# Patient Record
Sex: Female | Born: 1970 | Hispanic: No | Marital: Married | Smoking: Never smoker
Health system: Southern US, Community
[De-identification: ages and names within clinical notes are randomized; demographics above are authoritative.]

## PROBLEM LIST (undated history)

## (undated) DIAGNOSIS — B343 Parvovirus infection, unspecified: Secondary | ICD-10-CM

## (undated) DIAGNOSIS — E559 Vitamin D deficiency, unspecified: Secondary | ICD-10-CM

## (undated) DIAGNOSIS — B370 Candidal stomatitis: Secondary | ICD-10-CM

## (undated) DIAGNOSIS — I319 Disease of pericardium, unspecified: Secondary | ICD-10-CM

## (undated) DIAGNOSIS — M329 Systemic lupus erythematosus, unspecified: Secondary | ICD-10-CM

## (undated) DIAGNOSIS — I10 Essential (primary) hypertension: Secondary | ICD-10-CM

## (undated) DIAGNOSIS — M01X0 Direct infection of unspecified joint in infectious and parasitic diseases classified elsewhere: Secondary | ICD-10-CM

## (undated) DIAGNOSIS — R5383 Other fatigue: Secondary | ICD-10-CM

## (undated) DIAGNOSIS — M359 Systemic involvement of connective tissue, unspecified: Secondary | ICD-10-CM

## (undated) DIAGNOSIS — IMO0002 Reserved for concepts with insufficient information to code with codable children: Secondary | ICD-10-CM

## (undated) HISTORY — DX: Vitamin D deficiency, unspecified: E55.9

## (undated) HISTORY — PX: HERNIA REPAIR: SHX51

## (undated) HISTORY — DX: Direct infection of unspecified joint in infectious and parasitic diseases classified elsewhere: B34.3

## (undated) HISTORY — DX: Disease of pericardium, unspecified: I31.9

## (undated) HISTORY — DX: Direct infection of unspecified joint in infectious and parasitic diseases classified elsewhere: M01.X0

## (undated) HISTORY — DX: Essential (primary) hypertension: I10

## (undated) HISTORY — DX: Other fatigue: R53.83

## (undated) HISTORY — DX: Systemic involvement of connective tissue, unspecified: M35.9

## (undated) HISTORY — DX: Candidal stomatitis: B37.0

---

## 2010-12-08 ENCOUNTER — Emergency Department (HOSPITAL_BASED_OUTPATIENT_CLINIC_OR_DEPARTMENT_OTHER)
Admission: EM | Admit: 2010-12-08 | Discharge: 2010-12-08 | Disposition: A | Discharge status: Other Acute Inpatient Hospital | Payer: Commercial Managed Care - PPO | Source: Home / Self Care | Attending: Emergency Medicine | Admitting: Emergency Medicine

## 2010-12-08 ENCOUNTER — Emergency Department (INDEPENDENT_AMBULATORY_CARE_PROVIDER_SITE_OTHER): Payer: Commercial Managed Care - PPO

## 2010-12-08 ENCOUNTER — Inpatient Hospital Stay (HOSPITAL_COMMUNITY)
Admission: AD | Admit: 2010-12-08 | Discharge: 2010-12-10 | DRG: 315 | Disposition: A | Discharge status: Home / Self Care | Payer: Commercial Managed Care - PPO | Source: Other Acute Inpatient Hospital | Attending: Internal Medicine | Admitting: Internal Medicine

## 2010-12-08 DIAGNOSIS — R209 Unspecified disturbances of skin sensation: Secondary | ICD-10-CM

## 2010-12-08 DIAGNOSIS — R0602 Shortness of breath: Secondary | ICD-10-CM | POA: Insufficient documentation

## 2010-12-08 DIAGNOSIS — M6282 Rhabdomyolysis: Secondary | ICD-10-CM | POA: Insufficient documentation

## 2010-12-08 DIAGNOSIS — D72829 Elevated white blood cell count, unspecified: Secondary | ICD-10-CM | POA: Diagnosis present

## 2010-12-08 DIAGNOSIS — R079 Chest pain, unspecified: Secondary | ICD-10-CM | POA: Insufficient documentation

## 2010-12-08 DIAGNOSIS — R0789 Other chest pain: Secondary | ICD-10-CM

## 2010-12-08 DIAGNOSIS — G43909 Migraine, unspecified, not intractable, without status migrainosus: Secondary | ICD-10-CM | POA: Diagnosis present

## 2010-12-08 DIAGNOSIS — I319 Disease of pericardium, unspecified: Principal | ICD-10-CM | POA: Diagnosis present

## 2010-12-08 DIAGNOSIS — M329 Systemic lupus erythematosus, unspecified: Secondary | ICD-10-CM | POA: Diagnosis present

## 2010-12-08 LAB — CBC
Hemoglobin: 12.4 g/dL (ref 12.0–15.0)
MCH: 32.1 pg (ref 26.0–34.0)
MCV: 94.3 fL (ref 78.0–100.0)
Platelets: 256 10*3/uL (ref 150–400)
RBC: 3.86 MIL/uL — ABNORMAL LOW (ref 3.87–5.11)
WBC: 10.8 10*3/uL — ABNORMAL HIGH (ref 4.0–10.5)

## 2010-12-08 LAB — URINE MICROSCOPIC-ADD ON

## 2010-12-08 LAB — CK TOTAL AND CKMB (NOT AT ARMC)
CK, MB: 1.8 ng/mL (ref 0.3–4.0)
CK, MB: 1.5 ng/mL (ref 0.3–4.0)
CK, MB: 1.3 ng/mL (ref 0.3–4.0)
Relative Index: 0.1 (ref 0.0–2.5)
Relative Index: 0.1 (ref 0.0–2.5)
Relative Index: 0.1 (ref 0.0–2.5)
Total CK: 1716 U/L — ABNORMAL HIGH (ref 7–177)

## 2010-12-08 LAB — URINALYSIS, ROUTINE W REFLEX MICROSCOPIC
Glucose, UA: NEGATIVE mg/dL
Hgb urine dipstick: NEGATIVE
Leukocytes, UA: NEGATIVE
Specific Gravity, Urine: 1.025 (ref 1.005–1.030)
pH: 7.5 (ref 5.0–8.0)

## 2010-12-08 LAB — DIFFERENTIAL
Eosinophils Absolute: 0 10*3/uL (ref 0.0–0.7)
Lymphs Abs: 1.7 10*3/uL (ref 0.7–4.0)
Monocytes Relative: 4 % (ref 3–12)
Neutro Abs: 8.6 10*3/uL — ABNORMAL HIGH (ref 1.7–7.7)
Neutrophils Relative %: 80 % — ABNORMAL HIGH (ref 43–77)

## 2010-12-08 LAB — TROPONIN I
Troponin I: <0.3 ng/mL (ref <0.30)
Troponin I: <0.3 ng/mL (ref <0.30)

## 2010-12-08 LAB — COMPREHENSIVE METABOLIC PANEL
Albumin: 3.5 g/dL (ref 3.5–5.2)
BUN: 10 mg/dL (ref 6–23)
Calcium: 9.1 mg/dL (ref 8.4–10.5)
Glucose, Bld: 118 mg/dL — ABNORMAL HIGH (ref 70–99)
Total Protein: 7.9 g/dL (ref 6.0–8.3)

## 2010-12-08 LAB — D-DIMER, QUANTITATIVE: D-Dimer, Quant: 0.23 ug/mL-FEU (ref 0.00–0.48)

## 2010-12-09 LAB — BASIC METABOLIC PANEL
Calcium: 7.9 mg/dL — ABNORMAL LOW (ref 8.4–10.5)
Chloride: 110 mEq/L (ref 96–112)
Creatinine, Ser: 0.86 mg/dL (ref 0.4–1.2)
GFR calc Af Amer: >60 mL/min (ref >60)

## 2010-12-09 LAB — LIPID PANEL
Cholesterol: 182 mg/dL (ref 0–200)
LDL Cholesterol: 72 mg/dL (ref 0–99)
Total CHOL/HDL Ratio: 2.2 RATIO

## 2010-12-09 LAB — CBC
MCH: 32 pg (ref 26.0–34.0)
Platelets: 227 10*3/uL (ref 150–400)
RBC: 3.28 MIL/uL — ABNORMAL LOW (ref 3.87–5.11)

## 2010-12-09 LAB — T4, FREE: Free T4: 0.84 ng/dL (ref 0.80–1.80)

## 2010-12-09 LAB — HEMOGLOBIN A1C
Hgb A1c MFr Bld: 5.5 % (ref <5.7)
Mean Plasma Glucose: 111 mg/dL (ref <117)

## 2010-12-09 LAB — TSH: TSH: 0.247 u[IU]/mL — ABNORMAL LOW (ref 0.350–4.500)

## 2010-12-09 LAB — CK TOTAL AND CKMB (NOT AT ARMC): CK, MB: 1.3 ng/mL (ref 0.3–4.0)

## 2010-12-10 ENCOUNTER — Inpatient Hospital Stay (HOSPITAL_COMMUNITY): Payer: Commercial Managed Care - PPO

## 2010-12-10 LAB — CBC
MCH: 31.7 pg (ref 26.0–34.0)
MCHC: 33.4 g/dL (ref 30.0–36.0)
Platelets: 226 10*3/uL (ref 150–400)

## 2010-12-10 LAB — BASIC METABOLIC PANEL
CO2: 22 mEq/L (ref 19–32)
Calcium: 7.9 mg/dL — ABNORMAL LOW (ref 8.4–10.5)
Creatinine, Ser: 0.74 mg/dL (ref 0.4–1.2)
Glucose, Bld: 96 mg/dL (ref 70–99)
Sodium: 138 mEq/L (ref 135–145)

## 2010-12-10 LAB — CK TOTAL AND CKMB (NOT AT ARMC): Total CK: 408 U/L — ABNORMAL HIGH (ref 7–177)

## 2010-12-10 MED ORDER — IOHEXOL 300 MG/ML  SOLN
100.0000 mL | Freq: Once | INTRAMUSCULAR | Status: AC | PRN
  Administered 2010-12-10: 100 mL via INTRAVENOUS

## 2010-12-11 NOTE — Discharge Summary (Signed)
[name], [name] NO.:  [id number]  MEDICAL RECORD NO.:  [id number]  LOCATION:  2504                         FACILITY:  [facility]  PHYSICIAN:  Thad Ranger, MD       DATE OF BIRTH:  1971-06-06  DATE OF ADMISSION:  12/08/2010 DATE OF DISCHARGE:  12/10/2010                        DISCHARGE SUMMARY - REFERRING   PRIMARY NEUROLOGIST:  Dr. Despina Arias.  DISCHARGE DIAGNOSES: 1. Atypical chest pain likely secondary to pericarditis. 2. Recent diagnosis of lupus. 3. Mild rhabdomyolysis. 4. Migraine headaches.  DISCHARGE MEDICATIONS: 1. Indomethacin 25 mg p.o. t.i.d. with meals. 2. Protonix 40 mg p.o. daily. 3. Topiramate 50 mg p.o. nightly. 4. Tramadol 50 mg p.o. q.6 h. p.r.n. pain. 5. Oxycodone 5/325 mg 1-2 tablets p.o. every 4 hours as needed, 45     tablets assistance. 6. Vyvanse 70 mg p.o. daily. 7. Ocella 1 tablet p.o. daily. 8. Prednisone 60 mg for 1 week, then 50 mg for 1 week, then 40 mg for     1 week, then and 30 mg for 1 week, then 20 mg for 1 week, then 10     mg for 1 week, then continue per Hematology recommendations.  BRIEF HISTORY OF PRESENT ILLNESS:  Ms. 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  States that about a week ago she started having diffuse arthralgias include hands, right knee, ankle, right shoulder, and both hips.  The patient subsequently had rheumatological studies done which showed very high anti-double-stranded DNA  was diagnosed with lupus.  The patient had been on 2 steroid dose pack and was currently on second dose pack.  She felt that she was extremely weak and fatigued and then a night prior to the admission started having a left-sided chest pain which was worse with deep breathing and inspiration and leaning forward.  She tried taking Topamax, however, did not improve her pain.  However, because of concern she has her husband to drive her to the emergency department  for evaluation.  RADIOLOGICAL DATA:  Chest x-ray two-view on June 4, no evidence of active pulmonary disease.  Echocardiogram on June 4, systolic function normal, EF 60-65%, normal wall motion.  CT angiogram of the chest June 6, no pulmonary emboli, small bilateral pleural effusions plating independently, mild pericardial thickening with injection of epicardial fat, findings could possibly relate to pericarditis.  No measurable pericardial effusion.  Retroperitoneal flap of the upper abdomen also appears abnormally edematous could relate to lupus presentation.  BRIEF HOSPITALIZATION COURSE:  [name] is a 40 year old female who was admitted with atypical chest pain likely secondary to pericarditis. Chest pain has improved.  The patient likely had pericarditis related to her recent diagnosis of lupus.  She was admitted to tele monitored floor.  Cardiac enzymes remained negative for acute ACS.  However, the patient was noticed to have elevated CKs which peaked at 3347.  The patient was started on steroids and indomethacin.  Echocardiogram was obtained which showed normal EF with no pericardial effusion with IV fluid, CKs improved to 408 at the time of discharge. The patient did have intermittent chest pain during the hospitalization after starting the prednisone  which prompted CT angiogram of the chest which showed no pulmonary embolism.  The patient does not have any pericardial effusion. However, mild pericardial thickening possibly secondary to pericarditis. The patient will continue steroid with taper and follow up with Dr. Zenovia Jordan from Rheumatology Service on January 12, 2011.  The patient will be discharged home today.  Discharge time 35 minutes.     Thad Ranger, MD     RR/MEDQ  D:  12/10/2010  T:  12/10/2010  Job:  [id number]  cc:   Dr. Zenovia Jordan Dr. Despina Arias  Electronically Signed by Andres Labrum RAI  on 12/11/2010 01:02:31 PM

## 2010-12-31 NOTE — H&P (Signed)
[name], [name] NO.:  [id number]  MEDICAL RECORD NO.:  [id number]  LOCATION:  2504                         FACILITY:  [facility]  PHYSICIAN:  Peggye Pitt, M.D. DATE OF BIRTH:  19-Mar-1971  DATE OF ADMISSION:  12/08/2010 DATE OF DISCHARGE:                             HISTORY & PHYSICAL   PRIMARY CARE PHYSICIAN:  She is unassigned.  CHIEF COMPLAINT:  Chest pain.  HISTORY OF PRESENT ILLNESS:  [name] is a 40 year old woman.  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  She states that about 8 weeks ago she started having diffuse arthralgias including the hands, the right knee, both ankles, right shoulder, and both hips.  She subsequently had rheumatological studies and was found to have a very high antidouble-stranded DNA and was diagnosed with lupus.  Since then, she has been on 2 steroid dose packs and is currently on her second dose pack.  She notes that she has beenextremely weak and fatigued, but particularly starting last night at about 8 p.m. she started having left-sided chest pain.  It was worse with deep inspiration and leaning forward.  She tried taking Topamax, however, this did not improve her pain.  She tolerated the pain at home until 1 a.m.  However, because of concern, she asked her husband to drive her to the emergency department for evaluation.  She was subsequently transferred to [facility] for further evaluation and management by the Hospitalist Service.  ALLERGIES:  She has no known drug allergies.  PAST MEDICAL HISTORY:  Significant for migraine headaches.  HOME MEDICATIONS:  Topamax 50 mg daily for headaches.  She takes an oral contraceptive pill.  She has been taking melatonin to help her sleep. Rest of medications as per pharmacy reconciliation unavailable.  Vyvanse 70 mg daily.  SOCIAL HISTORY:  Denies alcohol, tobacco, or illicit drug use.  She is married, has 2 children.  She is very active in  sports.  FAMILY HISTORY:  Not significant for heart disease, cancer, strokes, or autoimmune diseases.  REVIEW OF SYSTEMS:  Only positive as stated in the HPI for diffuse arthralgias.  She also complains of a faint reticular rash over her extremities that has now disappeared.  She also complains of severe weakness and fatigue.  PHYSICAL EXAMINATION:  VITAL SIGNS:  On admission include a blood pressure 107/76, heart rate 74, respirations 14, sats of 99% on room air, and a temp of 97.9. GENERAL:  She is alert, awake, and oriented x3.  Her chest pain is currently improved. HEENT:  Normocephalic, atraumatic.  Her pupils are equally round and reactive to light and accommodation.  She has intact extraocular movements. NECK:  Supple.  No JVD, no lymphadenopathy, no bruits, no goiter. HEART:  Regular rate and rhythm with no appreciable murmurs, rubs, or gallops. LUNGS:  Clear to auscultation bilaterally. ABDOMEN:  Soft, nontender, and nondistended.  Positive bowel sounds. EXTREMITIES:  She has no lower extremity clubbing, cyanosis, or edema. She does have swollen MCPs and PIPs of bilateral hands.  LABORATORY DATA ON ADMISSION:  Sodium of 135, potassium 4.0, chloride 103, bicarb 23, BUN 10, creatinine 0.90, and glucose of 118.  All of  her LFTs are within normal limits with the exception of an AST of 52.  WBCs 10.8, hemoglobin 12.4, and platelets of 256.  Urinalysis is negative.  D- dimer is 0.23.  Cardiac enzymes are significant for a CPK of 3400, but otherwise normal CK-MBs and troponins.  Chest x-ray shows no acute distress.  ASSESSMENT AND PLAN: 1. Chest pain.  At this point, I believe this is more likely     pericarditis related to her recent diagnosis of lupus.  We will     admit her to Telemetry.  I will continue her prednisone as well as     start her on indomethacin.  We will get a 2-D echocardiogram and     follow up with 12-lead EKGs.  If pain does not abate, then maybe      cardiology consult will be indicated.  We will also rule her out     for acute coronary syndrome, although I truly doubt that this is     what is going on.  We will check a fasting lipid profile. 2. Mild rhabdomyolysis.  CK level is of 3400.  We will start her on     100 mL of normal saline as to prevent any signs of renal failure. 3. Lupus.  She is requesting that we hook her up with Dr. Zenovia Jordan, rheumatologist upon discharge.  I will see if this is     possible with our case management group. 4. DVT prophylaxis.  I will place her on Lovenox.     Peggye Pitt, M.D.     EH/MEDQ  D:  12/08/2010  T:  12/08/2010  Job:  [id number]  Electronically Signed by Peggye Pitt M.D. on 12/31/2010 07:46:25 PM

## 2012-12-13 DIAGNOSIS — M3212 Pericarditis in systemic lupus erythematosus: Secondary | ICD-10-CM | POA: Insufficient documentation

## 2014-04-30 DIAGNOSIS — M329 Systemic lupus erythematosus, unspecified: Secondary | ICD-10-CM | POA: Insufficient documentation

## 2014-07-20 ENCOUNTER — Emergency Department (HOSPITAL_BASED_OUTPATIENT_CLINIC_OR_DEPARTMENT_OTHER)
Admission: EM | Admit: 2014-07-20 | Discharge: 2014-07-21 | Disposition: A | Discharge status: Home / Self Care | Payer: No Typology Code available for payment source | Attending: Emergency Medicine | Admitting: Emergency Medicine

## 2014-07-20 ENCOUNTER — Emergency Department (HOSPITAL_BASED_OUTPATIENT_CLINIC_OR_DEPARTMENT_OTHER): Payer: No Typology Code available for payment source

## 2014-07-20 ENCOUNTER — Encounter (HOSPITAL_BASED_OUTPATIENT_CLINIC_OR_DEPARTMENT_OTHER): Payer: Self-pay | Admitting: *Deleted

## 2014-07-20 DIAGNOSIS — R0789 Other chest pain: Secondary | ICD-10-CM

## 2014-07-20 DIAGNOSIS — Z8739 Personal history of other diseases of the musculoskeletal system and connective tissue: Secondary | ICD-10-CM | POA: Diagnosis not present

## 2014-07-20 DIAGNOSIS — Z79899 Other long term (current) drug therapy: Secondary | ICD-10-CM | POA: Insufficient documentation

## 2014-07-20 DIAGNOSIS — R079 Chest pain, unspecified: Secondary | ICD-10-CM | POA: Diagnosis present

## 2014-07-20 DIAGNOSIS — Z3202 Encounter for pregnancy test, result negative: Secondary | ICD-10-CM | POA: Diagnosis not present

## 2014-07-20 HISTORY — DX: Reserved for concepts with insufficient information to code with codable children: IMO0002

## 2014-07-20 HISTORY — DX: Systemic lupus erythematosus, unspecified: M32.9

## 2014-07-20 LAB — CBC WITH DIFFERENTIAL/PLATELET
BASOS PCT: 0 % (ref 0–1)
Basophils Absolute: 0 10*3/uL (ref 0.0–0.1)
Eosinophils Absolute: 0.1 10*3/uL (ref 0.0–0.7)
Eosinophils Relative: 1 % (ref 0–5)
HCT: 36.3 % (ref 36.0–46.0)
HEMOGLOBIN: 12.4 g/dL (ref 12.0–15.0)
LYMPHS ABS: 2.7 10*3/uL (ref 0.7–4.0)
Lymphocytes Relative: 29 % (ref 12–46)
MCH: 33.1 pg (ref 26.0–34.0)
MCHC: 34.2 g/dL (ref 30.0–36.0)
MCV: 96.8 fL (ref 78.0–100.0)
MONO ABS: 1.1 10*3/uL — AB (ref 0.1–1.0)
MONOS PCT: 12 % (ref 3–12)
NEUTROS ABS: 5.4 10*3/uL (ref 1.7–7.7)
Neutrophils Relative %: 58 % (ref 43–77)
Platelets: 297 10*3/uL (ref 150–400)
RBC: 3.75 MIL/uL — ABNORMAL LOW (ref 3.87–5.11)
RDW: 12.2 % (ref 11.5–15.5)
WBC: 9.4 10*3/uL (ref 4.0–10.5)

## 2014-07-20 LAB — COMPREHENSIVE METABOLIC PANEL
ALT: 18 U/L (ref 0–35)
ANION GAP: 6 (ref 5–15)
AST: 27 U/L (ref 0–37)
Albumin: 3.9 g/dL (ref 3.5–5.2)
Alkaline Phosphatase: 55 U/L (ref 39–117)
BUN: 20 mg/dL (ref 6–23)
CALCIUM: 9.1 mg/dL (ref 8.4–10.5)
CO2: 26 mmol/L (ref 19–32)
CREATININE: 0.95 mg/dL (ref 0.50–1.10)
Chloride: 104 mEq/L (ref 96–112)
GFR calc Af Amer: 84 mL/min — ABNORMAL LOW (ref >90)
GFR, EST NON AFRICAN AMERICAN: 73 mL/min — AB (ref >90)
Glucose, Bld: 98 mg/dL (ref 70–99)
Potassium: 4.1 mmol/L (ref 3.5–5.1)
SODIUM: 136 mmol/L (ref 135–145)
TOTAL PROTEIN: 7.7 g/dL (ref 6.0–8.3)
Total Bilirubin: 0.3 mg/dL (ref 0.3–1.2)

## 2014-07-20 LAB — PREGNANCY, URINE: PREG TEST UR: NEGATIVE

## 2014-07-20 LAB — TROPONIN I

## 2014-07-20 MED ORDER — DEXTROSE 5 % IV SOLN: 1.0000 g | Freq: Once | INTRAVENOUS | Status: DC

## 2014-07-20 MED ORDER — NITROGLYCERIN 0.4 MG SL SUBL
0.4000 mg | SUBLINGUAL_TABLET | SUBLINGUAL | Status: DC | PRN
  Administered 2014-07-20: 0.4 mg via SUBLINGUAL
  Filled 2014-07-20: qty 1

## 2014-07-20 NOTE — ED Notes (Signed)
C/o left side cp x 1 hour  Radiating to left arm,  Fatigue, no change w movement,  Had 2 baby asa pta,  Denies n/v, no sob

## 2014-07-20 NOTE — ED Provider Notes (Addendum)
CSN: [id number]     Arrival date & time 07/20/14  2209 History  This chart was scribed for Hanley Seamen, MD by Richarda Overlie, ED Scribe. This patient was seen in room MH02/MH02 and the patient's care was started 11:13 PM.    Chief Complaint  Patient presents with  . Chest Pain   HPI HPI Comments: Eara Freehling is a 44 y.o. female who presents to the Emergency Department complaining of intermittent CP that started 1 hour ago in the left parasternal region. She describes the pain as sharp and states it slightly radiates to her left arm. She rates her pain as a 7/10 at this time. Pt states her pain worsens when she lies down and improves when sitting up or leaning forward. Pt reports a history of pericarditis in 2012; she says that her pain feels somewhat similar to that episode. She denies nausea, vomiting or shortness of breath. Pt states she took aspirin at home PTA after the onset of her symptoms. She states that she felt "clammy" on the way to the ED.   Past Medical History  Diagnosis Date  . Lupus    Past Surgical History  Procedure Laterality Date  . Hernia repair    . C sections     No family history on file. History  Substance Use Topics  . Smoking status: Never Smoker   . Smokeless tobacco: Not on file  . Alcohol Use: No   OB History    No data available     Review of Systems  Cardiovascular: Positive for chest pain.    Allergies  Sulfa antibiotics  Home Medications   Prior to Admission medications   Medication Sig Start Date End Date Taking? Authorizing Provider  Cyanocobalamin (VITAMIN B-12 PO) Take by mouth.   Yes Historical Provider, MD  hydroxychloroquine (PLAQUENIL) 200 MG tablet Take by mouth daily.   Yes Historical Provider, MD  Methotrexate Sodium (METHOTREXATE PO) Take by mouth.   Yes Historical Provider, MD   BP 95/68 mmHg  Pulse 80  Temp(Src) 98.2 F (36.8 C) (Oral)  Resp 16  Ht  (1.651 m)  Wt 143 lb (64.864 kg)  BMI 23.80 kg/m2  SpO2  99%  LMP 07/06/2014   Physical Exam General: Well-developed, well-nourished female in no acute distress; appearance consistent with age of record HENT: normocephalic; atraumatic Eyes: pupils equal, round and reactive to light; extraocular muscles intact Neck: supple Heart: regular rate and rhythm; no murmurs, rubs or gallops Lungs: clear to auscultation bilaterally Abdomen: soft; nondistended; nontender; no masses or hepatosplenomegaly; bowel sounds present Extremities: No deformity; full range of motion; pulses normal Neurologic: Awake, alert and oriented; motor function intact in all extremities and symmetric; no facial droop Skin: Warm and dry Psychiatric: Flat affect  ED Course  Procedures   DIAGNOSTIC STUDIES: Oxygen Saturation is 100% on RA, normal by my interpretation.    COORDINATION OF CARE: 11:21 PM Discussed treatment plan with pt at bedside and pt agreed to plan.    EKG Interpretation   Date/Time:  Friday July 20 2014 23:16:50 EST Ventricular Rate:  93 PR Interval:  134 QRS Duration: 88 QT Interval:  382 QTC Calculation: 474 R Axis:   32 Text Interpretation:  Normal sinus rhythm Abnormal ECG No significant  change was found Confirmed by Read Drivers  MD, Jonny Ruiz ([id number]) on 07/20/2014  11:21:32 PM      EKG Interpretation  Date/Time:  Friday July 20 2014 23:16:50 EST Ventricular Rate:  93 PR Interval:  134 QRS Duration: 88 QT Interval:  382 QTC Calculation: 474 R Axis:   32 Text Interpretation:  Normal sinus rhythm Abnormal ECG No significant change was found Confirmed by Read Drivers  MD, Jonny Ruiz ([id number]) on 07/20/2014 11:21:32 PM       MDM   Nursing notes and vitals signs, including pulse oximetry, reviewed.  Summary of this visit's results, reviewed by myself:  Labs:  Results for orders placed or performed during the hospital encounter of 07/20/14 (from the past 24 hour(s))  CBC with Differential     Status: Abnormal   Collection Time: 07/20/14 10:50 PM   Result Value Ref Range   WBC 9.4 4.0 - 10.5 K/uL   RBC 3.75 (L) 3.87 - 5.11 MIL/uL   Hemoglobin 12.4 12.0 - 15.0 g/dL   HCT 19.1 47.8 - 29.5 %   MCV 96.8 78.0 - 100.0 fL   MCH 33.1 26.0 - 34.0 pg   MCHC 34.2 30.0 - 36.0 g/dL   RDW 62.1 30.8 - 65.7 %   Platelets 297 150 - 400 K/uL   Neutrophils Relative % 58 43 - 77 %   Neutro Abs 5.4 1.7 - 7.7 K/uL   Lymphocytes Relative 29 12 - 46 %   Lymphs Abs 2.7 0.7 - 4.0 K/uL   Monocytes Relative 12 3 - 12 %   Monocytes Absolute 1.1 (H) 0.1 - 1.0 K/uL   Eosinophils Relative 1 0 - 5 %   Eosinophils Absolute 0.1 0.0 - 0.7 K/uL   Basophils Relative 0 0 - 1 %   Basophils Absolute 0.0 0.0 - 0.1 K/uL  Comprehensive metabolic panel     Status: Abnormal   Collection Time: 07/20/14 10:50 PM  Result Value Ref Range   Sodium 136 135 - 145 mmol/L   Potassium 4.1 3.5 - 5.1 mmol/L   Chloride 104 96 - 112 mEq/L   CO2 26 19 - 32 mmol/L   Glucose, Bld 98 70 - 99 mg/dL   BUN 20 6 - 23 mg/dL   Creatinine, Ser 8.46 0.50 - 1.10 mg/dL   Calcium 9.1 8.4 - 96.2 mg/dL   Total Protein 7.7 6.0 - 8.3 g/dL   Albumin 3.9 3.5 - 5.2 g/dL   AST 27 0 - 37 U/L   ALT 18 0 - 35 U/L   Alkaline Phosphatase 55 39 - 117 U/L   Total Bilirubin 0.3 0.3 - 1.2 mg/dL   GFR calc non Af Amer 73 (L) >90 mL/min   GFR calc Af Amer 84 (L) >90 mL/min   Anion gap 6 5 - 15  Troponin I     Status: None   Collection Time: 07/20/14 10:50 PM  Result Value Ref Range   Troponin I <0.03 <0.031 ng/mL  Pregnancy, urine     Status: None   Collection Time: 07/20/14 10:50 PM  Result Value Ref Range   Preg Test, Ur NEGATIVE NEGATIVE  CK     Status: Abnormal   Collection Time: 07/21/14 12:37 AM  Result Value Ref Range   Total CK 179 (H) 7 - 177 U/L  Troponin I     Status: None   Collection Time: 07/21/14  1:10 AM  Result Value Ref Range   Troponin I <0.03 <0.031 ng/mL    Imaging Studies: Dg Chest 2 View  07/20/2014   CLINICAL DATA:  Acute onset of left-sided chest pain for 1 hour,  with fatigue. Initial encounter.  EXAM: CHEST  2 VIEW  COMPARISON:  Chest radiograph performed  12/23/2010  FINDINGS: The lungs are well-aerated and clear. There is no evidence of focal opacification, pleural effusion or pneumothorax.  The heart is normal in size; the mediastinal contour is within normal limits. No acute osseous abnormalities are seen.  IMPRESSION: No acute cardiopulmonary process seen.   Electronically Signed   By: Roanna RaiderJeffery  Chang M.D.   On: 07/20/2014 23:23   12:37 AM No change with nitroglycerin sublingually.  1:55 AM Patient's pain is improved. He continues to be worse with supine position and improved with upright position. This is consistent with pericarditis although her EKG is not showing diffuse ST elevations. 2 troponins have been negative. 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 Otherwise she will follow-up with one of her colleagues later this morning. Her rheumatologist has advised her against taking NSAIDs.  I personally performed the services described in this documentation, which was scribed in my presence. The recorded information has been reviewed and is accurate.     Hanley SeamenJohn L Anoushka Divito, MD 07/21/14 [id number]  Hanley SeamenJohn L Lakasha Mcfall, MD 07/21/14 0157

## 2014-07-20 NOTE — ED Notes (Signed)
Chest pain x 20 minutes °

## 2014-07-20 NOTE — ED Notes (Signed)
2nd ntg not given due to bp 94/64

## 2014-07-21 LAB — TROPONIN I

## 2014-07-21 LAB — CK: CK TOTAL: 179 U/L — AB (ref 7–177)

## 2014-08-14 ENCOUNTER — Encounter: Payer: Self-pay | Admitting: *Deleted

## 2014-08-14 ENCOUNTER — Other Ambulatory Visit: Payer: Self-pay | Admitting: *Deleted

## 2014-10-16 ENCOUNTER — Ambulatory Visit (INDEPENDENT_AMBULATORY_CARE_PROVIDER_SITE_OTHER): Payer: No Typology Code available for payment source | Admitting: Neurology

## 2014-10-16 ENCOUNTER — Encounter: Payer: Self-pay | Admitting: Neurology

## 2014-10-16 VITALS — BP 108/76 | HR 78 | Resp 12 | Ht 64.75 in | Wt 146.2 lb

## 2014-10-16 DIAGNOSIS — F909 Attention-deficit hyperactivity disorder, unspecified type: Secondary | ICD-10-CM | POA: Diagnosis not present

## 2014-10-16 DIAGNOSIS — F988 Other specified behavioral and emotional disorders with onset usually occurring in childhood and adolescence: Secondary | ICD-10-CM | POA: Insufficient documentation

## 2014-10-16 DIAGNOSIS — M545 Low back pain, unspecified: Secondary | ICD-10-CM | POA: Insufficient documentation

## 2014-10-16 DIAGNOSIS — M542 Cervicalgia: Secondary | ICD-10-CM

## 2014-10-16 DIAGNOSIS — M533 Sacrococcygeal disorders, not elsewhere classified: Secondary | ICD-10-CM | POA: Insufficient documentation

## 2014-10-16 DIAGNOSIS — M329 Systemic lupus erythematosus, unspecified: Secondary | ICD-10-CM

## 2014-10-16 DIAGNOSIS — G43009 Migraine without aura, not intractable, without status migrainosus: Secondary | ICD-10-CM | POA: Diagnosis not present

## 2014-10-16 DIAGNOSIS — G43909 Migraine, unspecified, not intractable, without status migrainosus: Secondary | ICD-10-CM | POA: Insufficient documentation

## 2014-10-16 MED ORDER — TOPIRAMATE 100 MG PO TABS: 100.0000 mg | ORAL_TABLET | Freq: Every evening | ORAL | Status: DC | PRN

## 2014-10-16 MED ORDER — VYVANSE 70 MG PO CAPS: 70.0000 mg | ORAL_CAPSULE | Freq: Every day | ORAL | Status: DC

## 2014-10-16 NOTE — Progress Notes (Signed)
[facility]  PATIENT: [name] DOB: August 20, 1970  REFERRING DOCTOR OR PCP:  Odella Aquas SOURCE: patient and records form [facility]  _________________________________   HISTORICAL  CHIEF COMPLAINT:  Chief Complaint  Patient presents with  . Lupus    Sts. is having more aching in upper back--would like a tpi today if appropriate./fim  . Back Pain    HISTORY OF PRESENT ILLNESS:  [name] is a 44 year old woman who I seen in the past for joint pain, attention deficit disorder, insomnia and migraine.  Pain:   She has been seen for back pain that has been  in the SI joints or the piriformis muscles in the past, she has benefited from joint or trigger point injections when she has a flare-up.  She has been diagnosed with systemic lupus erythematosus and is seen by Dr. Myrtie Neither for her lupus she is on Plaquenil and methotrexate (increased dose to 25 mg).  Onset of SLE load a parvovirus infection in 2012.   At that time, her ANA was positive with the subsets showing the presence of anti-DNA antibodies. ESR was also mildly elevated.Marland Kitchen    Percocet helps the pain and she takes it sparingly.  She tries to exercise regularly and runs sometimes.     In November a piriformis TPI greatly helped the LBP pain.     Current pain is worse in the upper back and neck/shoulder region.     ADD: She has attention deficit disorder show to have poor performance in complex attentional and cognitive flexibility tasks on testing in 2011. She has been on Vyvanse and tolerates it well. It has helped her to stay focused and to perform better at work.  Insomnia: She is doing much better.    Now not having trouble falling asleep. Once she falls asleep she usually will stay asleep well. Exercise and taking tizanidine at bedtime has helped her.  Migraines: She is on topiramate intermittently for her migraines. She tolerates it well and it has helped to reduce the frequency. Time she will have  neck pain associated with migraines.  Migraines are worse when the lupus flares.    She gets occasional migraines lasting several days and some milder shorter ones.    Pain is mostly right sided.   No visual aura but if she gets a spaced out sensation as an aura, her migraines are usually worse.   MRI of the brain was fine in the past    REVIEW OF SYSTEMS: Constitutional: No fevers, chills, sweats, or change in appetite Eyes: No visual changes, double vision, eye pain Ear, nose and throat: No hearing loss, ear pain, nasal congestion, sore throat Cardiovascular: No chest pain, palpitations Respiratory: No shortness of breath at rest or with exertion.   No wheezes GastrointestinaI: No nausea, vomiting, diarrhea, abdominal pain, fecal incontinence Genitourinary: No dysuria, urinary retention or frequency.  No nocturia. Musculoskeletal: as above Integumentary: No rash, pruritus, skin lesions Neurological: as above Psychiatric: No depression at this time.  No anxiety Endocrine: No palpitations, diaphoresis, change in appetite, change in weigh or increased thirst Hematologic/Lymphatic: No anemia, purpura, petechiae. Allergic/Immunologic: No itchy/runny eyes, nasal congestion, recent allergic reactions, rashes  ALLERGIES: Allergies  Allergen Reactions  . Morphine   . Sulfa Antibiotics     angioedema    HOME MEDICATIONS:  Current outpatient prescriptions:  .  Cyanocobalamin (VITAMIN B-12 PO), Take by mouth., Disp: , Rfl:  .  drospirenone-ethinyl estradiol (YASMIN,ZARAH,SYEDA) 3-0.03 MG tablet, Take 1 tablet by mouth  daily., Disp: , Rfl:  .  folic acid (FOLVITE) 1 MG tablet, , Disp: , Rfl: 0 .  hydroxychloroquine (PLAQUENIL) 200 MG tablet, Take by mouth daily., Disp: , Rfl:  .  methotrexate (RHEUMATREX) 2.5 MG tablet, , Disp: , Rfl: 3 .  nystatin (MYCOSTATIN) 100000 UNIT/ML suspension, , Disp: , Rfl: 0 .  oxyCODONE-acetaminophen (PERCOCET/ROXICET) 5-325 MG per tablet, Take 1 tablet by  mouth 2 (two) times daily as needed for severe pain., Disp: , Rfl:  .  oxyCODONE-acetaminophen (ROXICET) 5-325 MG/5ML solution, Take by mouth every 4 (four) hours as needed for severe pain., Disp: , Rfl:  .  tiZANidine (ZANAFLEX) 4 MG capsule, Take 4 mg by mouth at bedtime as needed for muscle spasms., Disp: , Rfl:  .  topiramate (TOPAMAX) 100 MG tablet, Take 100 mg by mouth at bedtime as needed., Disp: , Rfl:  .  VYVANSE 70 MG capsule, Take 1 tablet by mouth daily., Disp: , Rfl: 0  PAST MEDICAL HISTORY: Past Medical History  Diagnosis Date  . Lupus   . Pericarditis   . Connective tissue disease   . Parvovirus B19 arthritis   . Fatigue   . Hypovitaminosis D   . Thrush   . Lupus     PAST SURGICAL HISTORY: Past Surgical History  Procedure Laterality Date  . Hernia repair    . Cesarean section      FAMILY HISTORY: Family History  Problem Relation Age of Onset  . Lupus    . Hypertension Father   . Lupus Maternal Grandmother     SOCIAL HISTORY:  History   Social History  . Marital Status: Married    Spouse Name: N/A  . Number of Children: N/A  . Years of Education: N/A   Occupational History  . Not on file.   Social History Main Topics  . Smoking status: Never Smoker   . Smokeless tobacco: Not on file  . Alcohol Use: No  . Drug Use: No  . Sexual Activity: Not on file   Other Topics Concern  . Not on file   Social History Narrative     PHYSICAL EXAM  Filed Vitals:   10/16/14 1342  BP: 108/76  Pulse: 78  Resp: 12  Height: 5' 4.75" (1.645 m)  Weight: 146 lb 3.2 oz (66.316 kg)    Body mass index is 24.51 kg/(m^2).   General: The patient is well-developed and well-nourished and in no acute distress  Neck: The neck is supple, no carotid bruits are noted.  The neck is tender right lower cervical paraspinals, trapezius and rhomboids and infraspinatus  Skin: Extremities are without significant edema.  Musculoskeletal:  Back is mildly tender, right of  L4L5  Neurologic Exam  Mental status: The patient is alert and oriented x 3 at the time of the examination. The patient has apparent normal recent and remote memory, with an apparently normal attention span and concentration ability.   Speech is normal.  Cranial nerves: Extraocular movements are full. Pupils are equal, round, and reactive to light and accomodation.  Visual fields are full.  Facial symmetry is present. There is good facial sensation to soft touch bilaterally.Facial strength is normal.  Trapezius and sternocleidomastoid strength is normal. No dysarthria is noted.  The tongue is midline, and the patient has symmetric elevation of the soft palate. No obvious hearing deficits are noted.  Motor:  Muscle bulk is normal.   Tone is normal. Strength is  5 / 5 in all 4 extremities.  Sensory: Sensory testing is intact to  Touch sensation in all 4 extremities.  Coordination: Cerebellar testing reveals good finger-nose-finger and heel-to-shin bilaterally.  Gait and station: Station is normal.   Gait is normal. Tandem gait is normal. Romberg is negative.   Reflexes: Deep tendon reflexes are symmetric and normal bilaterally.       DIAGNOSTIC DATA (LABS, IMAGING, TESTING) - I reviewed patient records, labs, notes, testing and imaging myself where available.  Lab Results  Component Value Date   WBC 9.4 07/20/2014   HGB 12.4 07/20/2014   HCT 36.3 07/20/2014   MCV 96.8 07/20/2014   PLT 297 07/20/2014      Component Value Date/Time   NA 136 07/20/2014 2250   K 4.1 07/20/2014 2250   CL 104 07/20/2014 2250   CO2 26 07/20/2014 2250   GLUCOSE 98 07/20/2014 2250   BUN 20 07/20/2014 2250   CREATININE 0.95 07/20/2014 2250   CALCIUM 9.1 07/20/2014 2250   PROT 7.7 07/20/2014 2250   ALBUMIN 3.9 07/20/2014 2250   AST 27 07/20/2014 2250   ALT 18 07/20/2014 2250   ALKPHOS 55 07/20/2014 2250   BILITOT 0.3 07/20/2014 2250   GFRNONAA 73* 07/20/2014 2250   GFRAA 84* 07/20/2014 2250    Lab Results  Component Value Date   CHOL 182 12/09/2010   HDL 83 12/09/2010   LDLCALC  12/09/2010    72        Total Cholesterol/HDL:CHD Risk Coronary Heart Disease Risk Table                     Men   Women  1/2 Average Risk   3.4   3.3  Average Risk       5.0   4.4  2 X Average Risk   9.6   7.1  3 X Average Risk  23.4   11.0        Use the calculated Patient Ratio above and the CHD Risk Table to determine the patient's CHD Risk.        ATP III CLASSIFICATION (LDL):  <100     mg/dL   Optimal  100-129  mg/dL   Near or Above                    Optimal  130-159  mg/dL   Borderline  160-189  mg/dL   High  >190     mg/dL   Very High   TRIG 133 12/09/2010   CHOLHDL 2.2 12/09/2010   Lab Results  Component Value Date   HGBA1C  12/08/2010    5.5 (NOTE)                                                                       According to the ADA Clinical Practice Recommendations for 2011, when HbA1c is used as a screening test:   >=6.5%   Diagnostic of Diabetes Mellitus           (if abnormal result  is confirmed)  5.7-6.4%   Increased risk of developing Diabetes Mellitus  References:Diagnosis and Classification of Diabetes Mellitus,Diabetes FFMB,8466,59(DJTTS 1):S62-S69 and Standards of Medical Care in         Diabetes - 2011,Diabetes VXBL,3903,00  (  Suppl 1):S11-S61.      ASSESSMENT AND PLAN  ADD (attention deficit disorder)  Sacroiliac joint pain  Midline low back pain without sciatica  Neck pain  Migraine without aura and without status migrainosus, not intractable  Disseminated lupus erythematosus   1.   Refill Vyvanse placed back on Topamax 2. Trigger point inject right cervical paraspinals (c6c7), trapezius and rhomboids and infraspinatus muscles with 80 mg depot Medrol in [place]. 3.   Remain active 4.  Return to clinic in 6 months or sooner if there are new or worsening neurologic symptoms.   Richard A. Felecia Shelling, MD, PhD 03/07/2223, 1:14 PM Certified in  Neurology, Clinical Neurophysiology, Sleep Medicine, Pain Medicine and Neuroimaging  [facility] [address] [place],  [address] [phone]

## 2014-10-22 ENCOUNTER — Telehealth: Payer: Self-pay | Admitting: Neurology

## 2014-10-22 MED ORDER — FROVATRIPTAN SUCCINATE 2.5 MG PO TABS: 2.5000 mg | ORAL_TABLET | ORAL | Status: DC | PRN

## 2014-10-22 NOTE — Telephone Encounter (Signed)
Spoke with Dr. Antonietta Barcelonaonuzi and per RAS, advised [facility] [address], per her request/fim

## 2014-10-22 NOTE — Telephone Encounter (Signed)
Patient is requesting Frova for her breakthrough migraines. She uses [facility] on [address] [place]. Her best call back is [phone] and it is okay to leave a message

## 2014-12-04 DIAGNOSIS — Z79899 Other long term (current) drug therapy: Secondary | ICD-10-CM | POA: Insufficient documentation

## 2015-02-19 ENCOUNTER — Other Ambulatory Visit: Payer: Self-pay | Admitting: Neurology

## 2015-02-19 ENCOUNTER — Encounter: Payer: Self-pay | Admitting: *Deleted

## 2015-02-19 MED ORDER — OXYCODONE-ACETAMINOPHEN 5-325 MG PO TABS: 1.0000 | ORAL_TABLET | Freq: Two times a day (BID) | ORAL | Status: DC | PRN

## 2015-02-19 MED ORDER — VYVANSE 70 MG PO CAPS: 70.0000 mg | ORAL_CAPSULE | Freq: Every day | ORAL | Status: DC

## 2015-02-19 NOTE — Telephone Encounter (Signed)
Pt called requesting refill on VYVANSE 70 MG capsule, oxyCODONE-acetaminophen (PERCOCET/ROXICET) 5-325 MG per tablet. Ok to leave msg on cell vmail [phone]. Pt requests if possible to have it ready today. She would very much appreciate it as she will not be able to get to our office until Friday after today (I did explain new protocol).

## 2015-02-19 NOTE — Progress Notes (Signed)
Oxycodone and Vyvanse rx's up front [facility]/fim

## 2015-04-23 ENCOUNTER — Encounter: Payer: Self-pay | Admitting: Neurology

## 2015-04-23 ENCOUNTER — Other Ambulatory Visit: Payer: Self-pay | Admitting: Neurology

## 2015-04-23 ENCOUNTER — Ambulatory Visit (INDEPENDENT_AMBULATORY_CARE_PROVIDER_SITE_OTHER): Payer: No Typology Code available for payment source | Admitting: Neurology

## 2015-04-23 VITALS — BP 114/80 | HR 68 | Resp 14 | Ht 64.75 in | Wt 145.6 lb

## 2015-04-23 DIAGNOSIS — F909 Attention-deficit hyperactivity disorder, unspecified type: Secondary | ICD-10-CM | POA: Diagnosis not present

## 2015-04-23 DIAGNOSIS — M329 Systemic lupus erythematosus, unspecified: Secondary | ICD-10-CM | POA: Diagnosis not present

## 2015-04-23 DIAGNOSIS — F988 Other specified behavioral and emotional disorders with onset usually occurring in childhood and adolescence: Secondary | ICD-10-CM

## 2015-04-23 DIAGNOSIS — M533 Sacrococcygeal disorders, not elsewhere classified: Secondary | ICD-10-CM | POA: Diagnosis not present

## 2015-04-23 DIAGNOSIS — M542 Cervicalgia: Secondary | ICD-10-CM | POA: Diagnosis not present

## 2015-04-23 MED ORDER — FROVATRIPTAN SUCCINATE 2.5 MG PO TABS: 2.5000 mg | ORAL_TABLET | ORAL | Status: DC | PRN

## 2015-04-23 MED ORDER — NARATRIPTAN HCL 2.5 MG PO TABS: 2.5000 mg | ORAL_TABLET | ORAL | Status: DC | PRN

## 2015-04-23 MED ORDER — ONDANSETRON HCL 4 MG PO TABS: 4.0000 mg | ORAL_TABLET | Freq: Three times a day (TID) | ORAL | Status: DC | PRN

## 2015-04-23 MED ORDER — VYVANSE 70 MG PO CAPS: 70.0000 mg | ORAL_CAPSULE | Freq: Every day | ORAL | Status: DC

## 2015-04-23 NOTE — Progress Notes (Signed)
[facility]  PATIENT: [name] DOB: [birth year]  REFERRING DOCTOR OR PCP:  Odella Aquas SOURCE: patient and records form [facility]  _________________________________   HISTORICAL  CHIEF COMPLAINT:  Chief Complaint  Patient presents with  . ADD    Sts. doing well with Vyvanse.  Sts. neck and back pain are ok right now.  Sts. in general h/a's are good on the Topamax, but she had a severe h/a in early August, and had to go to the ED (was also having chest pain that she thinks was due to Lupus.) .Sts. she would like to discuss Frova for breakthru h/a's.  Would also like a rx. for prn Zofran/fim   . Neck Pain  . Back Pain  . Migraines    HISTORY OF PRESENT ILLNESS:  [name] is a 44 year old woman with joint pain, attention deficit disorder, insomnia and migraine.   She has SLE and was recently started on methotrexate.     She had a severe episode of headache 02/2015 associated with chest pain and was seen at [facility].   She had a head CT and was told everything was fine.    IV Reglan, Dexamethasone.   I can't find the medical records on [facility]where.   Headache improved.     Migraines: Some migraines are associated with a 'loopy sensation' and others are entirely pain.  Often with the migraine, she will have neck pain associated with migraines.  Migraines are worse when the lupus flares.    She gets occasional migraines lasting several days and some milder shorter ones.    Pain is mostly right sided.   She denies visual aura but if she gets a spaced out sensation as an aura, her migraines are usually worse.   She was on topiramate for her migraines but did not feel good on it.    MRI of the brain was fine in the past  Musculoskeletal Pain:   She has had pain in the SI joints or the piriformis muscles for years and has benefited from joint or trigger point injections when she has a flare-up.    Percocet helps the pain and she takes it sparingly.  She tries to  exercise regularly and runs sometimes.  At the last visit, a piriformis TPI greatly helped the LBP pain.     Current pain is worse in the upper back and neck/shoulder region.     ADD: She has attention deficit disorder and had poor performance in complex attentional and cognitive flexibility tasks on testing in 2011. She has been on Vyvanse and tolerates it well. It has helped her to stay focused and to perform better at work.  Insomnia: She is doing much better. If she has insomnia, tizanidine may help.      SLE:   She has   systemic lupus erythematosus and is seen by Dr. Myrtie Neither for her lupus she is on Plaquenil and methotrexate with benefit.  The onset of SLE followed a parvovirus infection in 2012.   At that time, her ANA was positive with the subsets showing the presence of anti-DNA antibodies. ESR was also mildly elevated.Marland Kitchen    REVIEW OF SYSTEMS: Constitutional: No fevers, chills, sweats, or change in appetite Eyes: No visual changes, double vision, eye pain Ear, nose and throat: No hearing loss, ear pain, nasal congestion, sore throat Cardiovascular: No chest pain, palpitations Respiratory: No shortness of breath at rest or with exertion.   No wheezes GastrointestinaI: No nausea, vomiting, diarrhea,  abdominal pain, fecal incontinence Genitourinary: No dysuria, urinary retention or frequency.  No nocturia. Musculoskeletal: as above Integumentary: No rash, pruritus, skin lesions Neurological: as above Psychiatric: No depression at this time.  No anxiety Endocrine: No palpitations, diaphoresis, change in appetite, change in weigh or increased thirst Hematologic/Lymphatic: No anemia, purpura, petechiae. Allergic/Immunologic: No itchy/runny eyes, nasal congestion, recent allergic reactions, rashes  ALLERGIES: Allergies  Allergen Reactions  . Morphine   . Sulfa Antibiotics     angioedema    HOME MEDICATIONS:  Current outpatient prescriptions:  .  Cyanocobalamin (VITAMIN B-12  PO), Take by mouth., Disp: , Rfl:  .  folic acid (FOLVITE) 1 MG tablet, , Disp: , Rfl: 0 .  frovatriptan (FROVA) 2.5 MG tablet, Take 1 tablet (2.5 mg total) by mouth as needed for migraine. If recurs, may repeat after 2 hours. Max of 3 tabs in 24 hours., Disp: 10 tablet, Rfl: 11 .  hydroxychloroquine (PLAQUENIL) 200 MG tablet, Take by mouth daily., Disp: , Rfl:  .  Methotrexate Sodium (METHOTREXATE, PF,) 200 MG/8ML injection, Inject subcutaneous 1 cc once a week, Disp: , Rfl:  .  oxyCODONE-acetaminophen (PERCOCET/ROXICET) 5-325 MG per tablet, Take 1 tablet by mouth 2 (two) times daily as needed for severe pain., Disp: 60 tablet, Rfl: 0 .  tiZANidine (ZANAFLEX) 4 MG capsule, Take 4 mg by mouth at bedtime as needed for muscle spasms., Disp: , Rfl:  .  topiramate (TOPAMAX) 100 MG tablet, Take 1 tablet (100 mg total) by mouth at bedtime as needed., Disp: 90 tablet, Rfl: 3 .  VYVANSE 70 MG capsule, Take 1 capsule (70 mg total) by mouth daily., Disp: 90 capsule, Rfl: 0 .  drospirenone-ethinyl estradiol (YASMIN,ZARAH,SYEDA) 3-0.03 MG tablet, Take 1 tablet by mouth daily., Disp: , Rfl:  .  nystatin (MYCOSTATIN) 100000 UNIT/ML suspension, , Disp: , Rfl: 0 .  oxyCODONE-acetaminophen (ROXICET) 5-325 MG/5ML solution, Take by mouth every 4 (four) hours as needed for severe pain., Disp: , Rfl:   PAST MEDICAL HISTORY: Past Medical History  Diagnosis Date  . Lupus ([place])   . Pericarditis   . Connective tissue disease ([place])   . Parvovirus B19 arthritis   . Fatigue   . Hypovitaminosis D   . Thrush   . Lupus ([place])     PAST SURGICAL HISTORY: Past Surgical History  Procedure Laterality Date  . Hernia repair    . Cesarean section      FAMILY HISTORY: Family History  Problem Relation Age of Onset  . Lupus    . Hypertension Father   . Lupus Maternal Grandmother     SOCIAL HISTORY:  Social History   Social History  . Marital Status: Married    Spouse Name: N/A  . Number of Children: N/A  .  Years of Education: N/A   Occupational History  . Not on file.   Social History Main Topics  . Smoking status: Never Smoker   . Smokeless tobacco: Not on file  . Alcohol Use: No  . Drug Use: No  . Sexual Activity: Not on file   Other Topics Concern  . Not on file   Social History Narrative     PHYSICAL EXAM  Filed Vitals:   04/23/15 1045  BP: 114/80  Pulse: 68  Resp: 14  Height: 5' 4.75" (1.645 m)  Weight: 145 lb 9.6 oz (66.044 kg)    Body mass index is 24.41 kg/(m^2).   General: The patient is well-developed and well-nourished and in no acute distress  Neck: The neck is supple, no carotid bruits are noted.  The neck is mildly tender in the lower cervical paraspinals, trapezius and rhomboids and infraspinatus  Skin: Extremities are without significant edema.  Musculoskeletal:  Back is mildly tender, right of L4L5.  More tenderness in the piriformis muscle and sacroiliac  Neurologic Exam  Mental status: The patient is alert and oriented x 3 at the time of the examination. The patient has apparent normal recent and remote memory, with an apparently normal attention span and concentration ability.   Speech is normal.  Cranial nerves: Extraocular movements are full.   There is good facial sensation to soft touch bilaterally.Facial strength is normal.  Trapezius and sternocleidomastoid strength is normal. No dysarthria is noted.  The tongue is midline, and the patient has symmetric elevation of the soft palate. No obvious hearing deficits are noted.  Motor:  Muscle bulk is normal.   Tone is normal. Strength is  5 / 5 in all 4 extremities.   Sensory: Sensory testing is intact to touch sensation in all 4 extremities.  Coordination: Cerebellar testing reveals good finger-nose-finger  bilaterally.  Gait and station: Station is normal.   Gait is normal. Tandem gait is normal. Romberg is negative.   Reflexes: Deep tendon reflexes are symmetric and normal bilaterally.        DIAGNOSTIC DATA (LABS, IMAGING, TESTING) - I reviewed patient records, labs, notes, testing and imaging myself where available.  Lab Results  Component Value Date   WBC 9.4 07/20/2014   HGB 12.4 07/20/2014   HCT 36.3 07/20/2014   MCV 96.8 07/20/2014   PLT 297 07/20/2014      Component Value Date/Time   NA 136 07/20/2014 2250   K 4.1 07/20/2014 2250   CL 104 07/20/2014 2250   CO2 26 07/20/2014 2250   GLUCOSE 98 07/20/2014 2250   BUN 20 07/20/2014 2250   CREATININE 0.95 07/20/2014 2250   CALCIUM 9.1 07/20/2014 2250   PROT 7.7 07/20/2014 2250   ALBUMIN 3.9 07/20/2014 2250   AST 27 07/20/2014 2250   ALT 18 07/20/2014 2250   ALKPHOS 55 07/20/2014 2250   BILITOT 0.3 07/20/2014 2250   GFRNONAA 73* 07/20/2014 2250   GFRAA 84* 07/20/2014 2250   Lab Results  Component Value Date   CHOL 182 12/09/2010   HDL 83 12/09/2010   LDLCALC  12/09/2010    72        Total Cholesterol/HDL:CHD Risk Coronary Heart Disease Risk Table                     Men   Women  1/2 Average Risk   3.4   3.3  Average Risk       5.0   4.4  2 X Average Risk   9.6   7.1  3 X Average Risk  23.4   11.0        Use the calculated Patient Ratio above and the CHD Risk Table to determine the patient's CHD Risk.        ATP III CLASSIFICATION (LDL):  <100     mg/dL   Optimal  100-129  mg/dL   Near or Above                    Optimal  130-159  mg/dL   Borderline  160-189  mg/dL   High  >190     mg/dL   Very High   TRIG 133 12/09/2010  CHOLHDL 2.2 12/09/2010   Lab Results  Component Value Date   HGBA1C  12/08/2010    5.5 (NOTE)                                                                       According to the ADA Clinical Practice Recommendations for 2011, when HbA1c is used as a screening test:   >=6.5%   Diagnostic of Diabetes Mellitus           (if abnormal result  is confirmed)  5.7-6.4%   Increased risk of developing Diabetes Mellitus  References:Diagnosis and Classification of Diabetes  [place],Diabetes Care,2011,34(Suppl 1):S62-S69 and Standards of Medical Care in         Diabetes - 2011,Diabetes XYIA,1655,37  (Suppl 1):S11-S61.      ASSESSMENT AND PLAN  ADD (attention deficit disorder)  Disseminated lupus erythematosus ([place])  Neck pain  Sacroiliac joint pain   1.   Refill Vyvanse. 2.   She was given some samples of Trokendi XR as it is better tolerated than Topamax and this will hopefully help her headaches. placed back on Topamax 3.   Remain active and exercises as tolerated. 4.  Return to clinic in 6 months or sooner if there are new or worsening neurologic symptoms.   Richard A. Felecia Shelling, MD, PhD 48/27/0786, 75:44 AM Certified in Neurology, Clinical Neurophysiology, Sleep Medicine, Pain Medicine and Neuroimaging  [facility] [address] [place], [place] [address] [phone]

## 2015-04-23 NOTE — Patient Instructions (Signed)
If problems call [phone]

## 2015-07-29 DIAGNOSIS — M7550 Bursitis of unspecified shoulder: Secondary | ICD-10-CM | POA: Insufficient documentation

## 2015-07-29 DIAGNOSIS — M7520 Bicipital tendinitis, unspecified shoulder: Secondary | ICD-10-CM | POA: Insufficient documentation

## 2015-09-19 ENCOUNTER — Telehealth: Payer: Self-pay | Admitting: Neurology

## 2015-09-19 MED ORDER — VYVANSE 70 MG PO CAPS: 70.0000 mg | ORAL_CAPSULE | Freq: Every day | ORAL | Status: DC

## 2015-09-19 NOTE — Telephone Encounter (Signed)
Awaiting RAS sig/fim 

## 2015-09-19 NOTE — Telephone Encounter (Signed)
Pt called requesting refill for VYVANSE 70 MG capsule .

## 2015-09-20 NOTE — Telephone Encounter (Signed)
Vyvanse rx. up front [facility]/fim 

## 2015-10-22 ENCOUNTER — Ambulatory Visit (INDEPENDENT_AMBULATORY_CARE_PROVIDER_SITE_OTHER): Payer: No Typology Code available for payment source | Admitting: Neurology

## 2015-10-22 ENCOUNTER — Encounter: Payer: Self-pay | Admitting: Neurology

## 2015-10-22 VITALS — BP 120/82

## 2015-10-22 DIAGNOSIS — F909 Attention-deficit hyperactivity disorder, unspecified type: Secondary | ICD-10-CM | POA: Diagnosis not present

## 2015-10-22 DIAGNOSIS — M329 Systemic lupus erythematosus, unspecified: Secondary | ICD-10-CM

## 2015-10-22 DIAGNOSIS — M533 Sacrococcygeal disorders, not elsewhere classified: Secondary | ICD-10-CM | POA: Diagnosis not present

## 2015-10-22 DIAGNOSIS — F988 Other specified behavioral and emotional disorders with onset usually occurring in childhood and adolescence: Secondary | ICD-10-CM

## 2015-10-22 DIAGNOSIS — G43009 Migraine without aura, not intractable, without status migrainosus: Secondary | ICD-10-CM

## 2015-10-22 MED ORDER — TIZANIDINE HCL 4 MG PO CAPS: 4.0000 mg | ORAL_CAPSULE | Freq: Three times a day (TID) | ORAL | Status: DC | PRN

## 2015-10-22 MED ORDER — VYVANSE 70 MG PO CAPS: 70.0000 mg | ORAL_CAPSULE | Freq: Every day | ORAL | Status: DC

## 2015-10-22 NOTE — Progress Notes (Signed)
[facility]  PATIENT: [name] DOB: December 24, 1970  REFERRING DOCTOR OR PCP:  Odella Aquas SOURCE: patient and records form [facility]  _________________________________   HISTORICAL  CHIEF COMPLAINT:  Chief Complaint  Patient presents with  . ADD    Sts. she continues to tolerate Vyvanse well.  She was not able to afford Frova.  Would like to discuss other rescue med.  Needs r/f of Zofran, Tizandidine. post-dated rx. of Vyvanse/fim  . Lupus    HISTORY OF PRESENT ILLNESS:  [name] is a 45 year old woman with joint pain, attention deficit disorder, insomnia and migraine.   She has SLE and was recently started on methotrexate.        Migraines: Migraines are generally doing better.    Trokendi is better tolerated than generic topiramate.  She averages 1-2 migraines a month that are more severer.    Sleep deprivation can trigger one.     Some migraines are associated with cognitive fog  With word finding errors.     Often with the migraine, she will have neck pain associated with migraines.   Pain is mostly right sided.   She denies visual aura but if she gets a spaced out sensation as an aura, her migraines are usually worse.  Her dose of Trokendi is only 25 mg but that helps a lot.  MRI of the brain was fine in the past  Musculoskeletal Pain:   SI joint is doing better after PT.    Percocet helps the pain and she takes it sparingly.  She tries to exercise regularly and runs sometimes.  Piriformis TPI's greatly helped the LBP pain in the past.     Current pain is worse in the upper back and neck/shoulder region.     ADD: She has attention deficit disorder.   She had poor performance in complex attentional and cognitive flexibility tasks on testing in 2011. She has been on Vyvanse and tolerates it well. It has helped her to stay focused and to perform better at work.  Insomnia: She is doing much better. If she has insomnia, tizanidine may help.      SLE:    She has systemic lupus erythematosus and is seen by Dr. Myrtie Neither for her lupus she is on Plaquenil and methotrexate with benefit.  The onset of SLE followed a parvovirus infection in 2012.   At that time, her ANA was positive with the subsets showing the presence of anti-DNA antibodies. ESR was also mildly elevated.Marland Kitchen    REVIEW OF SYSTEMS: Constitutional: No fevers, chills, sweats, or change in appetite Eyes: No visual changes, double vision, eye pain Ear, nose and throat: No hearing loss, ear pain, nasal congestion, sore throat Cardiovascular: No chest pain, palpitations Respiratory: No shortness of breath at rest or with exertion.   No wheezes GastrointestinaI: No nausea, vomiting, diarrhea, abdominal pain, fecal incontinence Genitourinary: No dysuria, urinary retention or frequency.  No nocturia. Musculoskeletal: as above Integumentary: No rash, pruritus, skin lesions Neurological: as above Psychiatric: No depression at this time.  No anxiety Endocrine: No palpitations, diaphoresis, change in appetite, change in weigh or increased thirst Hematologic/Lymphatic: No anemia, purpura, petechiae. Allergic/Immunologic: No itchy/runny eyes, nasal congestion, recent allergic reactions, rashes  ALLERGIES: Allergies  Allergen Reactions  . Morphine   . Sulfa Antibiotics     angioedema    HOME MEDICATIONS:  Current outpatient prescriptions:  .  Cyanocobalamin (VITAMIN B-12 PO), Take by mouth., Disp: , Rfl:  .  folic acid (FOLVITE) 1  MG tablet, , Disp: , Rfl: 0 .  hydroxychloroquine (PLAQUENIL) 200 MG tablet, Take by mouth daily., Disp: , Rfl:  .  Methotrexate Sodium (METHOTREXATE, PF,) 200 MG/8ML injection, Inject subcutaneous 1 cc once a week, Disp: , Rfl:  .  nystatin (MYCOSTATIN) 100000 UNIT/ML suspension, , Disp: , Rfl: 0 .  ondansetron (ZOFRAN) 4 MG tablet, Take 1 tablet (4 mg total) by mouth every 8 (eight) hours as needed for nausea or vomiting., Disp: 20 tablet, Rfl: 3 .   oxyCODONE-acetaminophen (ROXICET) 5-325 MG/5ML solution, Take by mouth every 4 (four) hours as needed for severe pain., Disp: , Rfl:  .  tiZANidine (ZANAFLEX) 4 MG capsule, Take 4 mg by mouth at bedtime as needed for muscle spasms., Disp: , Rfl:  .  topiramate (TOPAMAX) 100 MG tablet, Take 1 tablet (100 mg total) by mouth at bedtime as needed., Disp: 90 tablet, Rfl: 3 .  VYVANSE 70 MG capsule, Take 1 capsule (70 mg total) by mouth daily., Disp: 90 capsule, Rfl: 0 .  drospirenone-ethinyl estradiol (YASMIN,ZARAH,SYEDA) 3-0.03 MG tablet, Take 1 tablet by mouth daily., Disp: , Rfl:   PAST MEDICAL HISTORY: Past Medical History  Diagnosis Date  . Lupus ([place])   . Pericarditis   . Connective tissue disease ([place])   . Parvovirus B19 arthritis   . Fatigue   . Hypovitaminosis D   . Thrush   . Lupus ([place])     PAST SURGICAL HISTORY: Past Surgical History  Procedure Laterality Date  . Hernia repair    . Cesarean section      FAMILY HISTORY: Family History  Problem Relation Age of Onset  . Lupus    . Hypertension Father   . Lupus Maternal Grandmother     SOCIAL HISTORY:  Social History   Social History  . Marital Status: Married    Spouse Name: N/A  . Number of Children: N/A  . Years of Education: N/A   Occupational History  . Not on file.   Social History Main Topics  . Smoking status: Never Smoker   . Smokeless tobacco: Not on file  . Alcohol Use: No  . Drug Use: No  . Sexual Activity: Not on file   Other Topics Concern  . Not on file   Social History Narrative     PHYSICAL EXAM  Filed Vitals:   10/22/15 1051  BP: 120/82    There is no weight on file to calculate BMI.   General: The patient is well-developed and well-nourished and in no acute distress  Neck: The neck is supple  Skin: Extremities are without significant edema.  Musculoskeletal:  Back is mildly tender, right of L4L5.  More tenderness in the piriformis muscle and sacroiliac  Neurologic  Exam  Mental status: The patient is alert and oriented x 3 at the time of the examination. The patient has apparent normal recent and remote memory, with an apparently normal attention span and concentration ability.   Speech is normal.  Cranial nerves: Extraocular movements are full.   There is good facial sensation to soft touch bilaterally.Facial strength is normal.  Trapezius and sternocleidomastoid strength is normal. No dysarthria is noted.  The tongue is midline, and the patient has symmetric elevation of the soft palate. No obvious hearing deficits are noted.  Motor:  Muscle bulk is normal.   Tone is normal. Strength is  5 / 5 in all 4 extremities.   Sensory: Sensory testing is intact to touch sensation in all 4 extremities.  Coordination: Cerebellar testing reveals good finger-nose-finger  bilaterally.  Gait and station: Station is normal.   Gait is normal. Tandem gait is normal. Romberg is negative.   Reflexes: Deep tendon reflexes are symmetric and normal bilaterally.       DIAGNOSTIC DATA (LABS, IMAGING, TESTING) - I reviewed patient records, labs, notes, testing and imaging myself where available.  Lab Results  Component Value Date   WBC 9.4 07/20/2014   HGB 12.4 07/20/2014   HCT 36.3 07/20/2014   MCV 96.8 07/20/2014   PLT 297 07/20/2014      Component Value Date/Time   NA 136 07/20/2014 2250   K 4.1 07/20/2014 2250   CL 104 07/20/2014 2250   CO2 26 07/20/2014 2250   GLUCOSE 98 07/20/2014 2250   BUN 20 07/20/2014 2250   CREATININE 0.95 07/20/2014 2250   CALCIUM 9.1 07/20/2014 2250   PROT 7.7 07/20/2014 2250   ALBUMIN 3.9 07/20/2014 2250   AST 27 07/20/2014 2250   ALT 18 07/20/2014 2250   ALKPHOS 55 07/20/2014 2250   BILITOT 0.3 07/20/2014 2250   GFRNONAA 73* 07/20/2014 2250   GFRAA 84* 07/20/2014 2250   Lab Results  Component Value Date   CHOL 182 12/09/2010   HDL 83 12/09/2010   LDLCALC  12/09/2010    72        Total Cholesterol/HDL:CHD  Risk Coronary Heart Disease Risk Table                     Men   Women  1/2 Average Risk   3.4   3.3  Average Risk       5.0   4.4  2 X Average Risk   9.6   7.1  3 X Average Risk  23.4   11.0        Use the calculated Patient Ratio above and the CHD Risk Table to determine the patient's CHD Risk.        ATP III CLASSIFICATION (LDL):  <100     mg/dL   Optimal  100-129  mg/dL   Near or Above                    Optimal  130-159  mg/dL   Borderline  160-189  mg/dL   High  >190     mg/dL   Very High   TRIG 133 12/09/2010   CHOLHDL 2.2 12/09/2010   Lab Results  Component Value Date   HGBA1C  12/08/2010    5.5 (NOTE)                                                                       According to the ADA Clinical Practice Recommendations for 2011, when HbA1c is used as a screening test:   >=6.5%   Diagnostic of Diabetes Mellitus           (if abnormal result  is confirmed)  5.7-6.4%   Increased risk of developing Diabetes Mellitus  References:Diagnosis and Classification of Diabetes Mellitus,Diabetes LPFX,9024,09(BDZHG 1):S62-S69 and Standards of Medical Care in         Diabetes - 2011,Diabetes Care,2011,34  (Suppl 1):S11-S61.      ASSESSMENT AND PLAN  Migraine without aura  and without status migrainosus, not intractable  ADD (attention deficit disorder)  Sacroiliac joint pain  Systemic lupus erythematosus ([place])    1.   Refill Vyvanse. 2.   Continue Trokendi XR as it is better tolerated than Topamax 3.   Remain active and exercises as tolerated. 4.  Return to clinic in 6 months or sooner if there are new or worsening neurologic symptoms.   Jejuan Scala A. Felecia Shelling, MD, PhD 03/21/3845, 65:99 AM Certified in Neurology, Clinical Neurophysiology, Sleep Medicine, Pain Medicine and Neuroimaging  [facility] [address], [place], [place] [address] [phone]

## 2016-03-03 ENCOUNTER — Telehealth: Payer: Self-pay | Admitting: Neurology

## 2016-03-03 NOTE — Telephone Encounter (Signed)
I have spoken with Dr. Antonietta Barcelonaonuzi, and per RAS, advised ok to take either the Oxycodone or Hydrocodone for p/o pain.  Oxycodone 5mg  is equal to about Hydrocodone 10mg .  Dr. Antonietta Barcelonaonuzi verbalized understanding of same, will probably accept Hydrocodone rx. from orthodontist but may not have it filled.  I have offered r/f of Oxycodone but she declined, stating she still has most of the rx. she received mos. ago left/fim

## 2016-03-03 NOTE — Telephone Encounter (Signed)
Patient called regarding Rx Orthodontic Surgeon wrote for her, Haskell FlirtVICODIN, patient states Dr. Epimenio FootSater prescribes PERCOCET and she wants to talk to Dr. Epimenio FootSater before taking this medication, patient is scheduled for outpatient surgery August 31st. Please call to advise.

## 2016-03-06 HISTORY — PX: MOUTH SURGERY: SHX715

## 2016-04-01 ENCOUNTER — Other Ambulatory Visit (HOSPITAL_BASED_OUTPATIENT_CLINIC_OR_DEPARTMENT_OTHER): Payer: Self-pay | Admitting: Family Medicine

## 2016-04-01 ENCOUNTER — Ambulatory Visit (HOSPITAL_BASED_OUTPATIENT_CLINIC_OR_DEPARTMENT_OTHER)
Admission: RE | Admit: 2016-04-01 | Discharge: 2016-04-01 | Disposition: A | Discharge status: Home / Self Care | Payer: No Typology Code available for payment source | Source: Ambulatory Visit | Attending: Family Medicine | Admitting: Family Medicine

## 2016-04-01 DIAGNOSIS — K37 Unspecified appendicitis: Secondary | ICD-10-CM

## 2016-04-01 DIAGNOSIS — R109 Unspecified abdominal pain: Secondary | ICD-10-CM | POA: Diagnosis not present

## 2016-04-01 MED ORDER — IOPAMIDOL (ISOVUE-300) INJECTION 61%
100.0000 mL | Freq: Once | INTRAVENOUS | Status: AC | PRN
  Administered 2016-04-01: 100 mL via INTRAVENOUS

## 2016-04-14 ENCOUNTER — Other Ambulatory Visit: Payer: Self-pay | Admitting: Neurology

## 2016-04-14 ENCOUNTER — Encounter: Payer: Self-pay | Admitting: Neurology

## 2016-04-14 ENCOUNTER — Ambulatory Visit (INDEPENDENT_AMBULATORY_CARE_PROVIDER_SITE_OTHER): Payer: No Typology Code available for payment source | Admitting: Neurology

## 2016-04-14 VITALS — BP 127/78 | HR 87 | Ht 65.0 in | Wt 141.5 lb

## 2016-04-14 DIAGNOSIS — M542 Cervicalgia: Secondary | ICD-10-CM

## 2016-04-14 DIAGNOSIS — G43009 Migraine without aura, not intractable, without status migrainosus: Secondary | ICD-10-CM | POA: Diagnosis not present

## 2016-04-14 DIAGNOSIS — F988 Other specified behavioral and emotional disorders with onset usually occurring in childhood and adolescence: Secondary | ICD-10-CM

## 2016-04-14 DIAGNOSIS — M329 Systemic lupus erythematosus, unspecified: Secondary | ICD-10-CM

## 2016-04-14 MED ORDER — ZONISAMIDE 100 MG PO CAPS: 100.0000 mg | ORAL_CAPSULE | Freq: Every day | ORAL | 11 refills | Status: DC

## 2016-04-14 MED ORDER — OXYCODONE-ACETAMINOPHEN 5-325 MG PO TABS: 1.0000 | ORAL_TABLET | ORAL | 0 refills | Status: DC | PRN

## 2016-04-14 MED ORDER — VYVANSE 70 MG PO CAPS: 70.0000 mg | ORAL_CAPSULE | Freq: Every day | ORAL | 0 refills | Status: DC

## 2016-04-14 MED ORDER — TIZANIDINE HCL 4 MG PO CAPS: 4.0000 mg | ORAL_CAPSULE | Freq: Three times a day (TID) | ORAL | 5 refills | Status: DC | PRN

## 2016-04-14 NOTE — Progress Notes (Signed)
[facility]  PATIENT: [name] DOB: 1970-07-20  REFERRING DOCTOR OR PCP:  Odella Aquas SOURCE: patient and records form [facility]  _________________________________   HISTORICAL  CHIEF COMPLAINT:  Chief Complaint  Patient presents with  . Rm 12  . Follow-up  . Migraine    Just got over a week long migraine.  . ADD    Continues to tolerate Vyvanse.  . Lupus    Having some neck pain. Is out of Tizanidine.  . Insomnia    Sleeping pretty well when she "can get sleep."    HISTORY OF PRESENT ILLNESS:  [name] is a 46 year old woman with migraine, joint pain, attention deficit disorder, insomnia and migraine.   She has SLE and was recently started on methotrexate.        Migraines: Migraines were generally doing better until a couple weeks ago.    She had poor sleep x several days associated with call.   Once awake she has difficulty falling back asleep.    Trokendi XR 25 is better tolerated than generic topiramate but she has noted some hair loss.  She also get paresthesias.    She averaged 1-2 migraine days a month until earlier this month.    Sleep deprivation can trigger one.     Some migraines are associated with cognitive fog  With word finding errors.     Often with the migraine, she will have neck pain associated with migraines.   Pain is mostly right sided.   She denies visual aura but if she gets a spaced out sensation as an aura, her migraines are usually worse.  Her dose of Trokendi is only 25 mg but that helps a lot.  MRI of the brain was fine in the past  Musculoskeletal Pain/SLE:   She has systemic lupus erythematosus and is seen by Dr. Myrtie Neither for her lupus she is on Plaquenil and methotrexate with benefit.  The onset of SLE followed a parvovirus infection in 2012.   At that time, her ANA was positive with the subsets showing the presence of anti-DNA antibodies. ESR was also mildly elevated.  SI joint is doing better after PT.    Percocet  helps the pain and she takes it sparingly.  She tries to exercise regularly and runs sometimes.  Piriformis TPI's greatly helped the LBP pain in the past.       ADD: She has attention deficit disorder.   She had poor performance in complex attentional and cognitive flexibility tasks on testing in 2011. She has been on Vyvanse and tolerates it well. It has helped her to stay focused and to perform better at work.   REVIEW OF SYSTEMS: Constitutional: No fevers, chills, sweats, or change in appetite Eyes: No visual changes, double vision, eye pain Ear, nose and throat: No hearing loss, ear pain, nasal congestion, sore throat Cardiovascular: No chest pain, palpitations Respiratory: No shortness of breath at rest or with exertion.   No wheezes GastrointestinaI: No nausea, vomiting, diarrhea, abdominal pain, fecal incontinence Genitourinary: No dysuria, urinary retention or frequency.  No nocturia. Musculoskeletal: as above Integumentary: No rash, pruritus, skin lesions Neurological: as above Psychiatric: No depression at this time.  No anxiety Endocrine: No palpitations, diaphoresis, change in appetite, change in weigh or increased thirst Hematologic/Lymphatic: No anemia, purpura, petechiae. Allergic/Immunologic: No itchy/runny eyes, nasal congestion, recent allergic reactions, rashes  ALLERGIES: Allergies  Allergen Reactions  . Morphine Other (See Comments)    Intolerance  . Sulfa Antibiotics  angioedema    HOME MEDICATIONS:  Current Outpatient Prescriptions:  .  Cyanocobalamin (VITAMIN B-12 PO), Take by mouth., Disp: , Rfl:  .  cycloSPORINE (RESTASIS) 0.05 % ophthalmic emulsion, Apply to eye., Disp: , Rfl:  .  folic acid (FOLVITE) 1 MG tablet, , Disp: , Rfl: 0 .  hydroxychloroquine (PLAQUENIL) 200 MG tablet, Take by mouth daily., Disp: , Rfl:  .  Methotrexate Sodium (METHOTREXATE, PF,) 200 MG/8ML injection, Inject subcutaneous 1 cc once a week, Disp: , Rfl:  .  nystatin  (MYCOSTATIN) 100000 UNIT/ML suspension, as needed. , Disp: , Rfl: 0 .  ondansetron (ZOFRAN) 4 MG tablet, Take 1 tablet (4 mg total) by mouth every 8 (eight) hours as needed for nausea or vomiting., Disp: 20 tablet, Rfl: 3 .  VYVANSE 70 MG capsule, Take 1 capsule (70 mg total) by mouth daily., Disp: 90 capsule, Rfl: 0 .  drospirenone-ethinyl estradiol (YASMIN,ZARAH,SYEDA) 3-0.03 MG tablet, Take 1 tablet by mouth daily., Disp: , Rfl:  .  oxyCODONE-acetaminophen (ROXICET) 5-325 MG tablet, Take 1 tablet by mouth every 4 (four) hours as needed for severe pain., Disp: 60 tablet, Rfl: 0 .  tiZANidine (ZANAFLEX) 4 MG capsule, Take 1 capsule (4 mg total) by mouth 3 (three) times daily as needed for muscle spasms. (Patient not taking: Reported on 04/14/2016), Disp: 90 capsule, Rfl: 5 .  zonisamide (ZONEGRAN) 100 MG capsule, Take 1 capsule (100 mg total) by mouth at bedtime., Disp: 30 capsule, Rfl: 11  PAST MEDICAL HISTORY: Past Medical History:  Diagnosis Date  . Connective tissue disease ([place])   . Fatigue   . Hypovitaminosis D   . Lupus   . Lupus   . 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 Pericarditis   . Thrush     PAST SURGICAL HISTORY: Past Surgical History:  Procedure Laterality Date  . CESAREAN SECTION    . HERNIA REPAIR    . MOUTH SURGERY  03/2016    FAMILY HISTORY: Family History  Problem Relation Age of Onset  . Lupus    . Hypertension Father   . Lupus Maternal Grandmother     SOCIAL HISTORY:  Social History   Social History  . Marital status: Married    Spouse name: N/A  . Number of children: N/A  . Years of education: N/A   Occupational History  . Not on file.   Social History Main Topics  . Smoking status: Never Smoker  . Smokeless tobacco: Not on file  . Alcohol use No  . Drug use: No  . Sexual activity: Not on file   Other Topics Concern  . Not on file   Social History Narrative   Lives at home w/ her family   Right-handed   Caffeine: cup of tea per day       PHYSICAL EXAM  Vitals:   04/14/16 1114  BP: 127/78  Pulse: 87  Weight: 141 lb 8 oz (64.2 kg)  Height: 5' 5"  (1.651 m)    Body mass index is 23.55 kg/m.   General: The patient is well-developed and well-nourished and in no acute distress  Neck: The neck is supple  Skin: Extremities are without significant edema.  Musculoskeletal:  Back is mildly tender, right of L4L5.  More tenderness in the piriformis muscle and sacroiliac  Neurologic Exam  Mental status: The patient is alert and oriented x 3 at the time of the examination. The patient has apparent normal recent and remote memory, with an apparently normal attention span  and concentration ability.   Speech is normal.  Cranial nerves: Extraocular movements are full.   There is good facial sensation to soft touch bilaterally.Facial strength is normal.  Trapezius and sternocleidomastoid strength is normal. No dysarthria is noted.  The tongue is midline, and the patient has symmetric elevation of the soft palate. No obvious hearing deficits are noted.  Motor:  Muscle bulk is normal.   Tone is normal. Strength is  5 / 5 in all 4 extremities.   Sensory: Sensory testing is intact to touch sensation in all 4 extremities.  Coordination: Cerebellar testing reveals good finger-nose-finger  bilaterally.  Gait and station: Station is normal.   Gait is normal. Tandem gait is normal. Romberg is negative.   Reflexes: Deep tendon reflexes are symmetric and normal bilaterally.       DIAGNOSTIC DATA (LABS, IMAGING, TESTING) - I reviewed patient records, labs, notes, testing and imaging myself where available.  Lab Results  Component Value Date   WBC 9.4 07/20/2014   HGB 12.4 07/20/2014   HCT 36.3 07/20/2014   MCV 96.8 07/20/2014   PLT 297 07/20/2014      Component Value Date/Time   NA 136 07/20/2014 2250   K 4.1 07/20/2014 2250   CL 104 07/20/2014 2250   CO2 26 07/20/2014 2250   GLUCOSE 98 07/20/2014 2250   BUN 20  07/20/2014 2250   CREATININE 0.95 07/20/2014 2250   CALCIUM 9.1 07/20/2014 2250   PROT 7.7 07/20/2014 2250   ALBUMIN 3.9 07/20/2014 2250   AST 27 07/20/2014 2250   ALT 18 07/20/2014 2250   ALKPHOS 55 07/20/2014 2250   BILITOT 0.3 07/20/2014 2250   GFRNONAA 73 (L) 07/20/2014 2250   GFRAA 84 (L) 07/20/2014 2250   Lab Results  Component Value Date   CHOL 182 12/09/2010   HDL 83 12/09/2010   LDLCALC  12/09/2010    72        Total Cholesterol/HDL:CHD Risk Coronary Heart Disease Risk Table                     Men   Women  1/2 Average Risk   3.4   3.3  Average Risk       5.0   4.4  2 X Average Risk   9.6   7.1  3 X Average Risk  23.4   11.0        Use the calculated Patient Ratio above and the CHD Risk Table to determine the patient's CHD Risk.        ATP III CLASSIFICATION (LDL):  <100     mg/dL   Optimal  100-129  mg/dL   Near or Above                    Optimal  130-159  mg/dL   Borderline  160-189  mg/dL   High  >190     mg/dL   Very High   TRIG 133 12/09/2010   CHOLHDL 2.2 12/09/2010   Lab Results  Component Value Date   HGBA1C  12/08/2010    5.5 (NOTE)  According to the ADA Clinical Practice Recommendations for 2011, when HbA1c is used as a screening test:   >=6.5%   Diagnostic of Diabetes Mellitus           (if abnormal result  is confirmed)  5.7-6.4%   Increased risk of developing Diabetes Mellitus  References:Diagnosis and Classification of Diabetes Mellitus,Diabetes Care,2011,34(Suppl 1):S62-S69 and Standards of Medical Care in         Diabetes - 2011,Diabetes SVXB,9390,30  (Suppl 1):S11-S61.      ASSESSMENT AND PLAN  Migraine without aura and without status migrainosus, not intractable  Attention deficit disorder, unspecified hyperactivity presence  Neck pain  Systemic lupus erythematosus, unspecified SLE type, unspecified organ involvement status ([place])   1.   Refill Vyvanse 70 mg  daily. 2.   Change Topiramate to zonisamide to see if better tolerated.    3.   Remain active and exercises as tolerated. 4.  Return to clinic in 12 months or sooner if there are new or worsening neurologic symptoms.   Janiene Aarons A. Felecia Shelling, MD, PhD 03/28/3006, 6:22 PM Certified in Neurology, Clinical Neurophysiology, Sleep Medicine, Pain Medicine and Neuroimaging  [facility] [address], [place], [place] [address] [phone]

## 2016-10-06 IMAGING — CR DG CHEST 2V
2 series · 2 of 2 positions shown · non-contrast
Comparison: Chest radiograph performed 12/23/2010

CLINICAL DATA: Acute onset of left-sided chest pain for 1 hour,
with fatigue. Initial encounter.

EXAM:
CHEST  2 VIEW

[w chest pa]
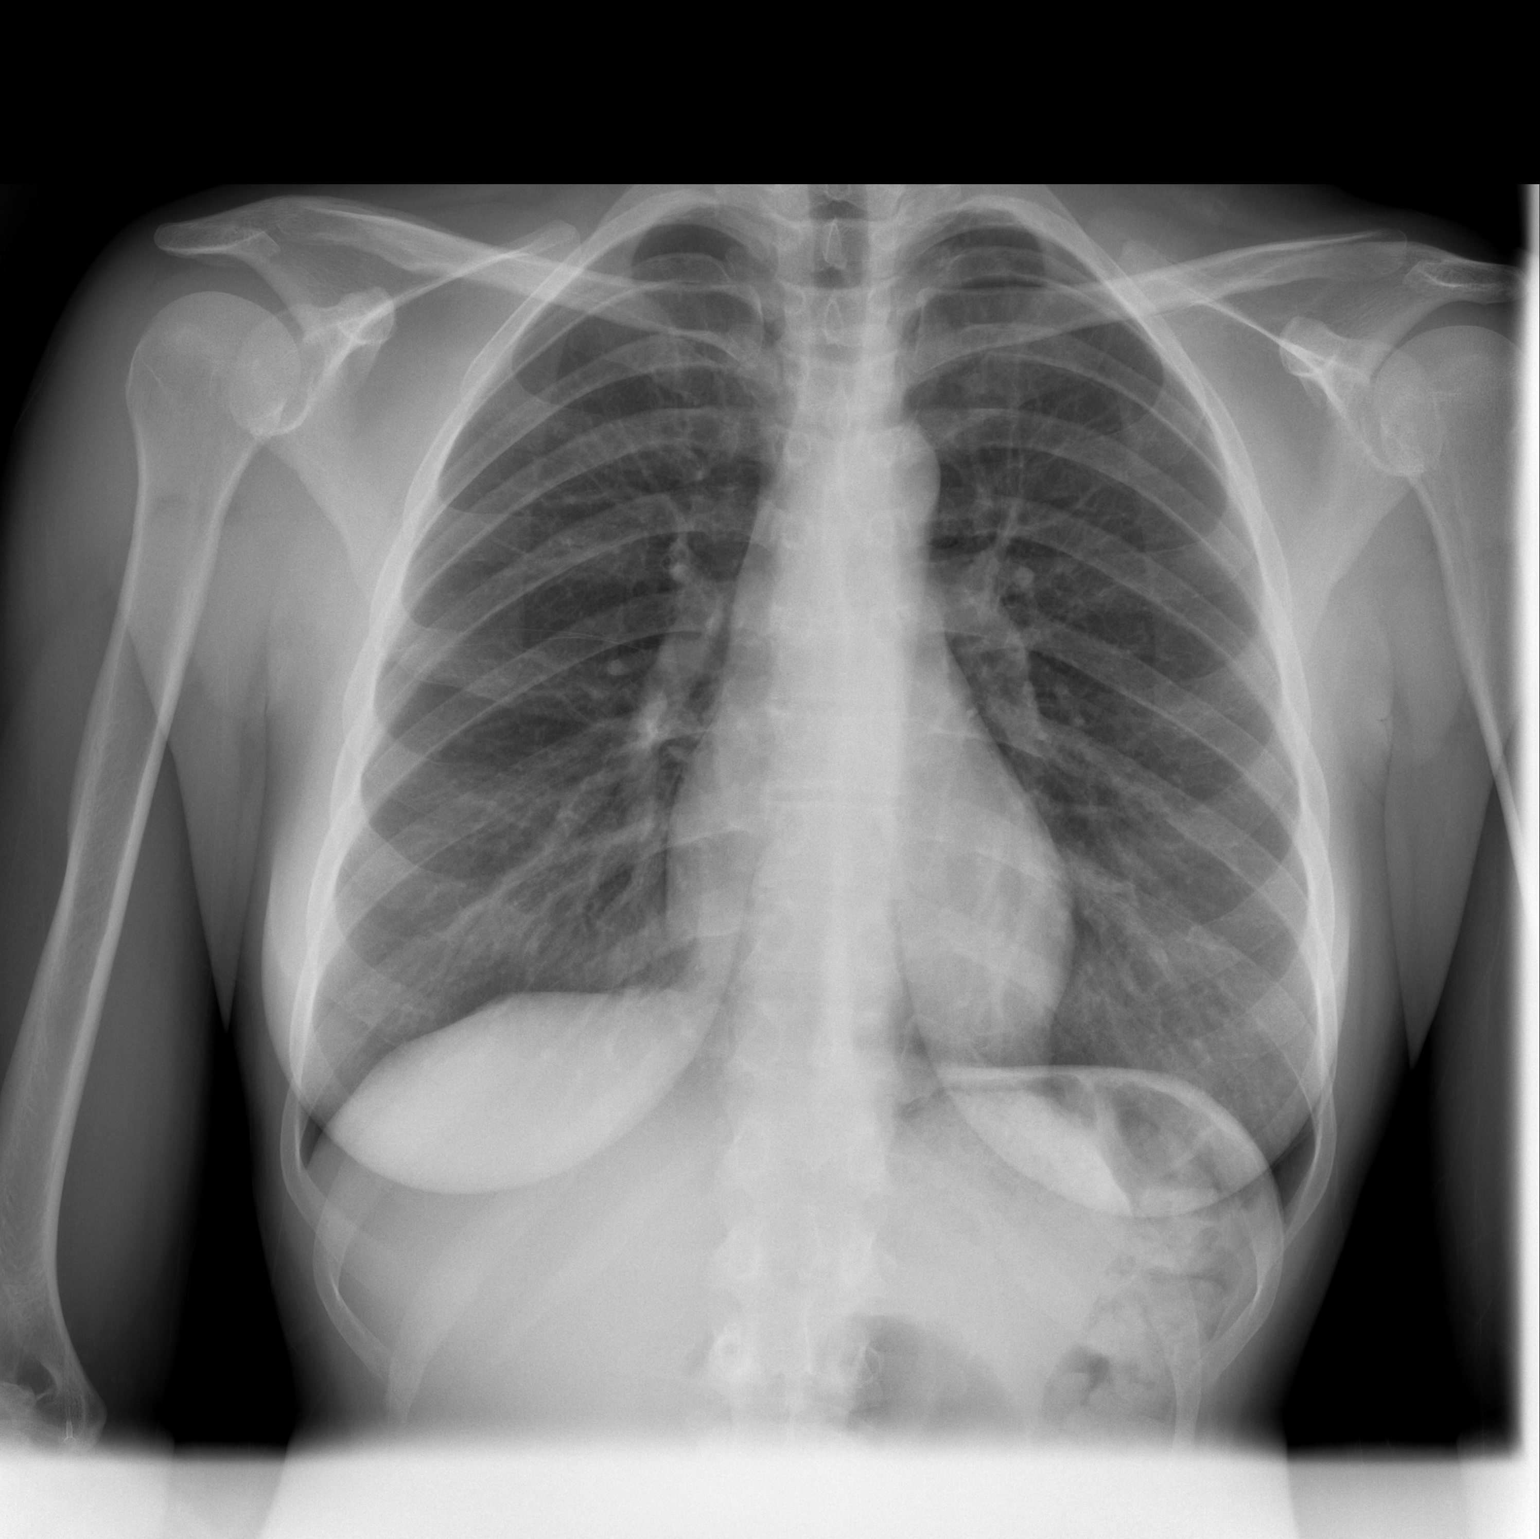

[w chest lat]
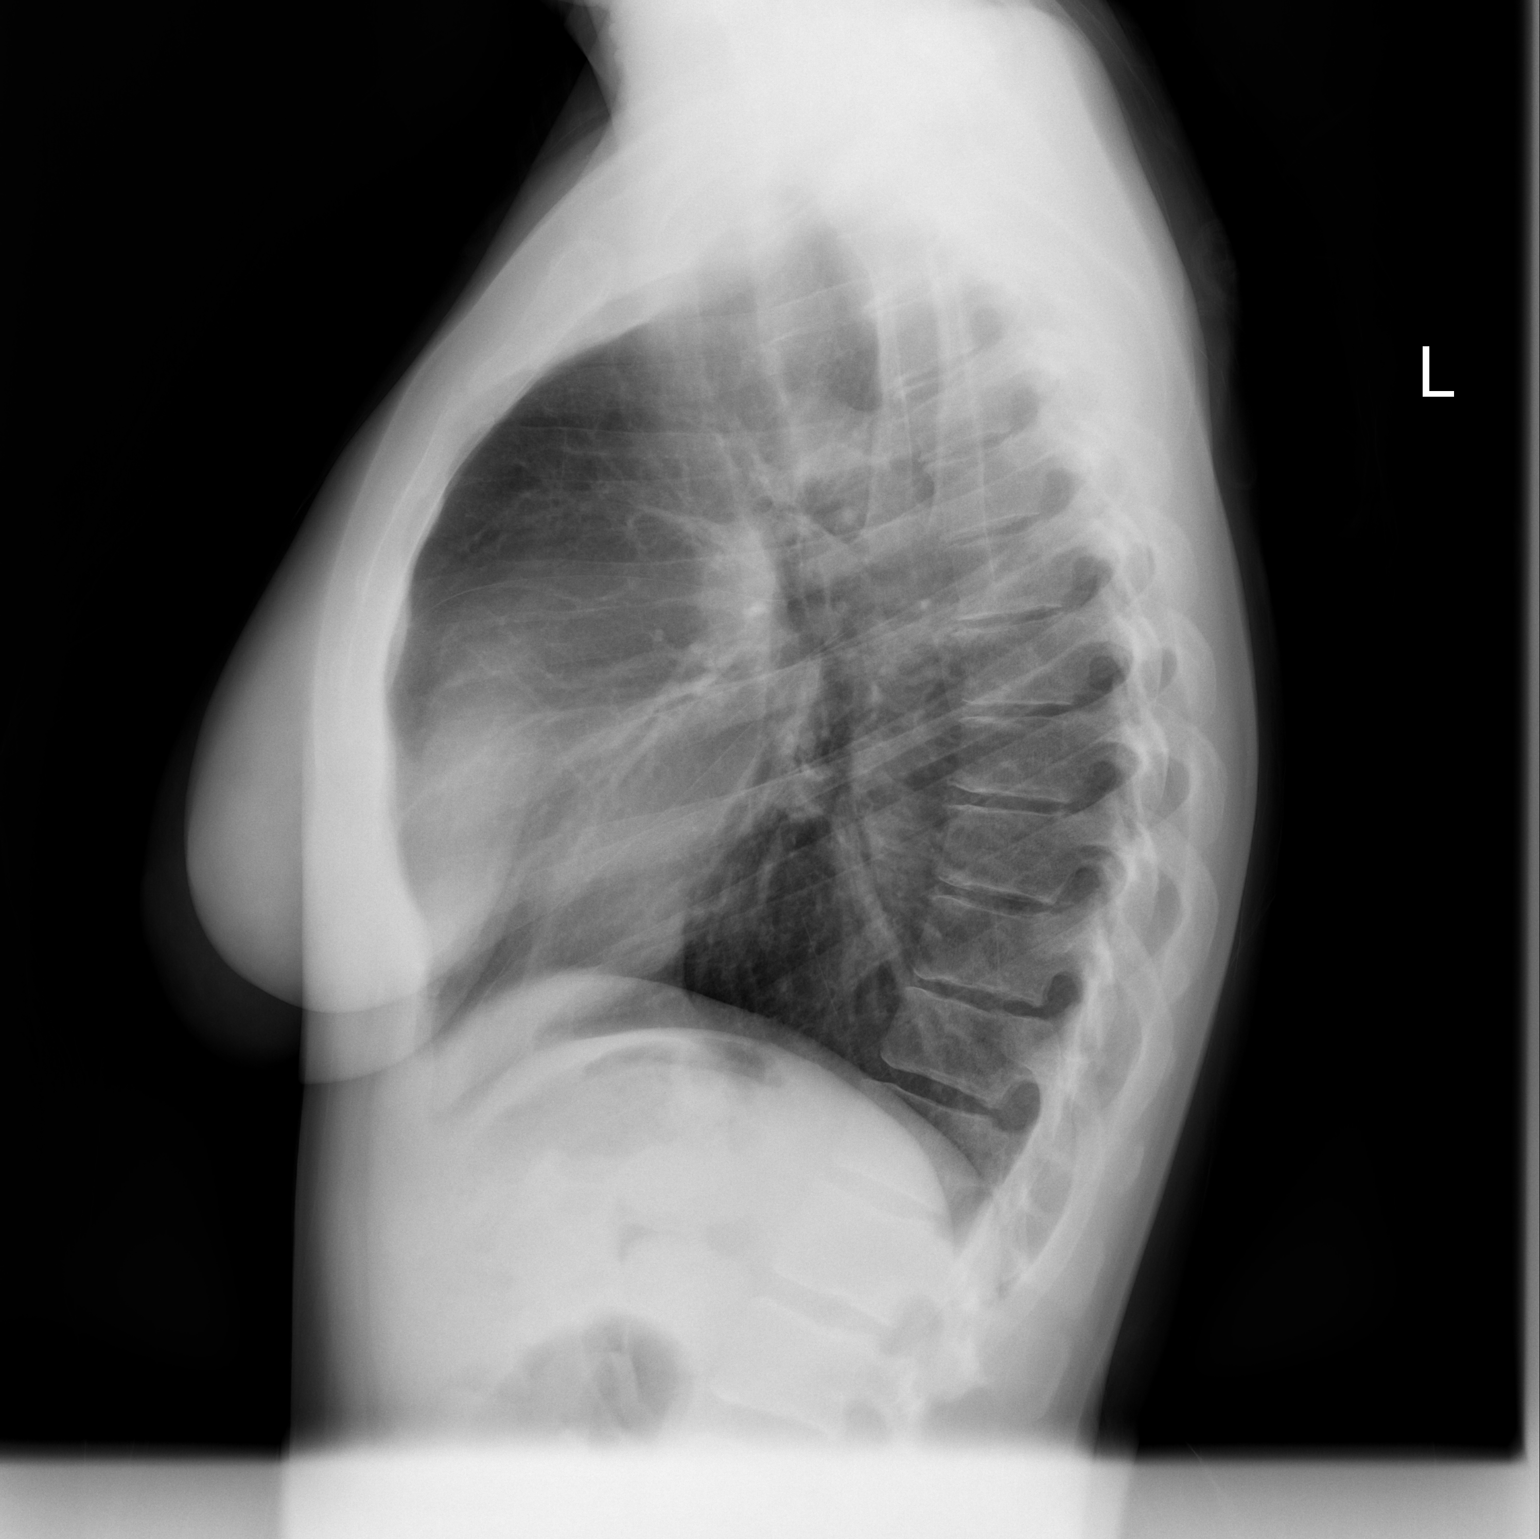

[2 of 2 positions shown; findings below may reference images not displayed]

FINDINGS: The lungs are well-aerated and clear. There is no evidence of focal
opacification, pleural effusion or pneumothorax.

The heart is normal in size; the mediastinal contour is within
normal limits. No acute osseous abnormalities are seen.
IMPRESSION: No acute cardiopulmonary process seen.

## 2016-10-13 ENCOUNTER — Encounter: Payer: Self-pay | Admitting: Neurology

## 2016-10-13 ENCOUNTER — Ambulatory Visit (INDEPENDENT_AMBULATORY_CARE_PROVIDER_SITE_OTHER): Payer: No Typology Code available for payment source | Admitting: Neurology

## 2016-10-13 VITALS — BP 106/60 | Resp 14 | Ht 65.0 in | Wt 145.0 lb

## 2016-10-13 DIAGNOSIS — F988 Other specified behavioral and emotional disorders with onset usually occurring in childhood and adolescence: Secondary | ICD-10-CM

## 2016-10-13 DIAGNOSIS — M329 Systemic lupus erythematosus, unspecified: Secondary | ICD-10-CM

## 2016-10-13 DIAGNOSIS — M545 Low back pain: Secondary | ICD-10-CM | POA: Diagnosis not present

## 2016-10-13 DIAGNOSIS — G43009 Migraine without aura, not intractable, without status migrainosus: Secondary | ICD-10-CM | POA: Diagnosis not present

## 2016-10-13 MED ORDER — OXYCODONE-ACETAMINOPHEN 5-325 MG PO TABS: 1.0000 | ORAL_TABLET | ORAL | 0 refills | Status: DC | PRN

## 2016-10-13 MED ORDER — VYVANSE 70 MG PO CAPS: 70.0000 mg | ORAL_CAPSULE | Freq: Every day | ORAL | 0 refills | Status: DC

## 2016-10-13 MED ORDER — ONDANSETRON HCL 4 MG PO TABS: 4.0000 mg | ORAL_TABLET | Freq: Three times a day (TID) | ORAL | 3 refills | Status: AC | PRN

## 2016-10-13 NOTE — Progress Notes (Signed)
[facility]  PATIENT: [name] DOB: [birth year]  REFERRING DOCTOR OR PCP:  Odella Aquas SOURCE: patient and records form [facility]  _________________________________   HISTORICAL  CHIEF COMPLAINT:  Chief Complaint  Patient presents with  . Migraines    Sts. h/a's have been ok. A couple of bad ones related to sleep deprivation.  C/O some right lbp, no radiation. Exercising helps. Old MRI's from [place] requested to be pushed to PACS so [facility] can view them/fim  . Neck Pain  . Lupus  . Insomnia  . 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     Migraines: Migraines are generally doing well.   She opted to not switch from Topamax to zonisamide.    She has not been taking Trokendi as much.  Sleep deprivation can trigger one.     Some migraines are associated with cognitive fog  With word finding errors.     Often with the migraine, she will have neck pain associated with migraines.   Pain is mostly right sided.   She denies visual aura but if she gets a spaced out sensation as an aura, her migraines are usually worse.  Her dose of Trokendi is only 25 mg but that helps a lot.  MRI of the brain was fine in the past  Musculoskeletal Pain/SLE:   Her worse pain is in the right buttock and SI region.  With mildly different pain, a TPI in the piriformis had helped.   She has systemic lupus erythematosus and is seen by Dr. Myrtie Neither for her lupus she is on Plaquenil and methotrexate with benefit.  The onset of SLE followed a parvovirus infection in 2012.   At that time, her ANA was positive with the subsets showing the presence of anti-DNA antibodies. ESR was also mildly elevated.  SI joint improved after PT.    Percocet helps the pain and she takes it sparingly.  She tries to exercise regularly and runs sometimes.     ADD: She has attention deficit  disorder x many years.    On a test in 2011, she had poor performance in complex attentional and cognitive flexibility tasks. She has been on Vyvanse and tolerates it well. It has helped her to stay focused and to perform better at work.   REVIEW OF SYSTEMS: Constitutional: No fevers, chills, sweats, or change in appetite Eyes: No visual changes, double vision, eye pain Ear, nose and throat: No hearing loss, ear pain, nasal congestion, sore throat Cardiovascular: No chest pain, palpitations Respiratory: No shortness of breath at rest or with exertion.   No wheezes GastrointestinaI: No nausea, vomiting, diarrhea, abdominal pain, fecal incontinence Genitourinary: No dysuria, urinary retention or frequency.  No nocturia. Musculoskeletal: as above Integumentary: No rash, pruritus, skin lesions Neurological: as above Psychiatric: No depression at this time.  No anxiety Endocrine: No palpitations, diaphoresis, change in appetite, change in weigh or increased thirst Hematologic/Lymphatic: No anemia, purpura, petechiae. Allergic/Immunologic: No itchy/runny eyes, nasal congestion, recent allergic reactions, rashes  ALLERGIES: Allergies  Allergen Reactions  . Morphine Other (See Comments)    Intolerance  . Sulfa Antibiotics     angioedema    HOME MEDICATIONS:  Current Outpatient Prescriptions:  .  Cyanocobalamin (VITAMIN B-12 PO), Take by mouth., Disp: , Rfl:  .  cycloSPORINE (RESTASIS) 0.05 % ophthalmic emulsion, Apply to eye., Disp: , Rfl:  .  folic  acid (FOLVITE) 1 MG tablet, , Disp: , Rfl: 0 .  hydroxychloroquine (PLAQUENIL) 200 MG tablet, Take by mouth daily., Disp: , Rfl:  .  Methotrexate Sodium (METHOTREXATE, PF,) 200 MG/8ML injection, Inject subcutaneous 1 cc once a week, Disp: , Rfl:  .  nystatin (MYCOSTATIN) 100000 UNIT/ML suspension, as needed. , Disp: , Rfl: 0 .  ondansetron (ZOFRAN) 4 MG tablet, Take 1 tablet (4 mg total) by mouth every 8 (eight) hours as needed for nausea  or vomiting., Disp: 20 tablet, Rfl: 3 .  oxyCODONE-acetaminophen (ROXICET) 5-325 MG tablet, Take 1 tablet by mouth every 4 (four) hours as needed for severe pain., Disp: 60 tablet, Rfl: 0 .  tiZANidine (ZANAFLEX) 4 MG capsule, Take 1 capsule (4 mg total) by mouth 3 (three) times daily as needed for muscle spasms., Disp: 90 capsule, Rfl: 5 .  VYVANSE 70 MG capsule, Take 1 capsule (70 mg total) by mouth daily., Disp: 90 capsule, Rfl: 0 .  drospirenone-ethinyl estradiol (YASMIN,ZARAH,SYEDA) 3-0.03 MG tablet, Take 1 tablet by mouth daily., Disp: , Rfl:  .  zonisamide (ZONEGRAN) 100 MG capsule, Take 1 capsule (100 mg total) by mouth at bedtime. (Patient not taking: Reported on 10/13/2016), Disp: 30 capsule, Rfl: 11  PAST MEDICAL HISTORY: Past Medical History:  Diagnosis Date  . Connective tissue disease ([place])   . Fatigue   . Hypovitaminosis D   . Lupus   . Lupus   . 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 Pericarditis   . Thrush     PAST SURGICAL HISTORY: Past Surgical History:  Procedure Laterality Date  . CESAREAN SECTION    . HERNIA REPAIR    . MOUTH SURGERY  03/2016    FAMILY HISTORY: Family History  Problem Relation Age of Onset  . Lupus    . Hypertension Father   . Lupus Maternal Grandmother     SOCIAL HISTORY:  Social History   Social History  . Marital status: Married    Spouse name: N/A  . Number of children: N/A  . Years of education: N/A   Occupational History  . Not on file.   Social History Main Topics  . Smoking status: Never Smoker  . Smokeless tobacco: Never Used  . Alcohol use No  . Drug use: No  . Sexual activity: Not on file   Other Topics Concern  . Not on file   Social History Narrative   Lives at home w/ her family   Right-handed   Caffeine: cup of tea per day     PHYSICAL EXAM  Vitals:   10/13/16 1102  BP: 106/60  Resp: 14  Weight: 145 lb (65.8 kg)  Height: _0  (1.651 m)    Body mass index is 24.13 kg/m.   General: The  patient is well-developed and well-nourished and in no acute distress  Neck: The neck is supple  Skin: Extremities are without significant edema.  Musculoskeletal:  Back is mildly tender, right of L4L5.  More tenderness in the piriformis muscle and sacroiliac  Neurologic Exam  Mental status: The patient is alert and oriented x 3 at the time of the examination. The patient has apparent normal recent and remote memory, with an apparently normal attention span and concentration ability.   Speech is normal.  Cranial nerves: Extraocular movements are full.   There is good facial sensation to soft touch bilaterally.Facial strength is normal.  Trapezius and sternocleidomastoid strength is normal. No dysarthria is noted.  The tongue is midline, and  the patient has symmetric elevation of the soft palate. No obvious hearing deficits are noted.  Motor:  Muscle bulk is normal.   Tone is normal. Strength is  5 / 5 in all 4 extremities.   Sensory: Sensory testing is intact to touch sensation in all 4 extremities.  Coordination: Cerebellar testing reveals good finger-nose-finger  bilaterally.  Gait and station: Station is normal.   Gait is normal. Tandem gait is normal. Romberg is negative.   Reflexes: Deep tendon reflexes are symmetric and normal bilaterally.       DIAGNOSTIC DATA (LABS, IMAGING, TESTING) - I reviewed patient records, labs, notes, testing and imaging myself where available.  Lab Results  Component Value Date   WBC 9.4 07/20/2014   HGB 12.4 07/20/2014   HCT 36.3 07/20/2014   MCV 96.8 07/20/2014   PLT 297 07/20/2014      Component Value Date/Time   NA 136 07/20/2014 2250   K 4.1 07/20/2014 2250   CL 104 07/20/2014 2250   CO2 26 07/20/2014 2250   GLUCOSE 98 07/20/2014 2250   BUN 20 07/20/2014 2250   CREATININE 0.95 07/20/2014 2250   CALCIUM 9.1 07/20/2014 2250   PROT 7.7 07/20/2014 2250   ALBUMIN 3.9 07/20/2014 2250   AST 27 07/20/2014 2250   ALT 18 07/20/2014 2250     ALKPHOS 55 07/20/2014 2250   BILITOT 0.3 07/20/2014 2250   GFRNONAA 73 (L) 07/20/2014 2250   GFRAA 84 (L) 07/20/2014 2250   Lab Results  Component Value Date   CHOL 182 12/09/2010   HDL 83 12/09/2010   LDLCALC  12/09/2010    72        Total Cholesterol/HDL:CHD Risk Coronary Heart Disease Risk Table                     Men   Women  1/2 Average Risk   3.4   3.3  Average Risk       5.0   4.4  2 X Average Risk   9.6   7.1  3 X Average Risk  23.4   11.0        Use the calculated Patient Ratio above and the CHD Risk Table to determine the patient's CHD Risk.        ATP III CLASSIFICATION (LDL):  <100     mg/dL   Optimal  100-129  mg/dL   Near or Above                    Optimal  130-159  mg/dL   Borderline  160-189  mg/dL   High  >190     mg/dL   Very High   TRIG 133 12/09/2010   CHOLHDL 2.2 12/09/2010   Lab Results  Component Value Date   HGBA1C  12/08/2010    5.5 (NOTE)                                                                       According to the ADA Clinical Practice Recommendations for 2011, when HbA1c is used as a screening test:   >=6.5%   Diagnostic of Diabetes Mellitus           (if abnormal result  is confirmed)  5.7-6.4%   Increased risk of developing Diabetes Mellitus  References:Diagnosis and Classification of Diabetes Mellitus,Diabetes Care,2011,34(Suppl 1):S62-S69 and Standards of Medical Care in         Diabetes - 2011,Diabetes ZLYT,8004,47  (Suppl 1):S11-S61.      ASSESSMENT AND PLAN  Right low back pain, unspecified chronicity, with sciatica presence unspecified - Plan: Ambulatory referral to Physical Therapy  Attention deficit disorder, unspecified hyperactivity presence  Systemic lupus erythematosus, unspecified SLE type, unspecified organ involvement status ([place])  Migraine without aura and without status migrainosus, not intractable   1.   Refill Vyvanse 70 mg daily. 2.   Refill oxycodone (uses it very sparingly)    3.   Right  piriformis muscle injection with 80 mg Depo-Medrol In Marcaine using sterile technique. She tolerated the procedure well. There were no complications. 4.  Return to clinic in 12 months or sooner if there are new or worsening neurologic symptoms.   Lonnetta Kniskern A. Felecia Shelling, MD, PhD 1/58/0638, 68:54 PM Certified in Neurology, Clinical Neurophysiology, Sleep Medicine, Pain Medicine and Neuroimaging  [facility] [address] [place], [place] [address] [phone]

## 2016-11-02 ENCOUNTER — Telehealth: Payer: Self-pay | Admitting: Neurology

## 2016-11-02 NOTE — Telephone Encounter (Signed)
Sherika from [facility] called to inform that they were unable to schedule pt for physical therapy.  Several  attempts were made to contact.  She can be reached at [phone]

## 2016-11-02 NOTE — Telephone Encounter (Signed)
LMOM for Dr. Antonietta Barcelona that Raquel Sarna with PT is attempting to contact her to sched. PT. Sherika's # left on message/fim

## 2017-02-01 ENCOUNTER — Telehealth: Payer: Self-pay | Admitting: *Deleted

## 2017-02-01 MED ORDER — VYVANSE 70 MG PO CAPS: 70.0000 mg | ORAL_CAPSULE | Freq: Every day | ORAL | 0 refills | Status: DC

## 2017-02-01 NOTE — Telephone Encounter (Signed)
Vyvanse rx. up front [facility]/fim

## 2017-04-13 ENCOUNTER — Encounter: Payer: Self-pay | Admitting: Neurology

## 2017-04-13 ENCOUNTER — Ambulatory Visit (INDEPENDENT_AMBULATORY_CARE_PROVIDER_SITE_OTHER): Payer: No Typology Code available for payment source | Admitting: Neurology

## 2017-04-13 VITALS — BP 120/78 | Resp 14 | Ht 65.0 in | Wt 146.0 lb

## 2017-04-13 DIAGNOSIS — M533 Sacrococcygeal disorders, not elsewhere classified: Secondary | ICD-10-CM | POA: Diagnosis not present

## 2017-04-13 DIAGNOSIS — M542 Cervicalgia: Secondary | ICD-10-CM

## 2017-04-13 DIAGNOSIS — M329 Systemic lupus erythematosus, unspecified: Secondary | ICD-10-CM

## 2017-04-13 DIAGNOSIS — G43009 Migraine without aura, not intractable, without status migrainosus: Secondary | ICD-10-CM | POA: Diagnosis not present

## 2017-04-13 DIAGNOSIS — F988 Other specified behavioral and emotional disorders with onset usually occurring in childhood and adolescence: Secondary | ICD-10-CM | POA: Diagnosis not present

## 2017-04-13 MED ORDER — AMPHETAMINE-DEXTROAMPHETAMINE 10 MG PO TABS: ORAL_TABLET | ORAL | 0 refills | Status: DC

## 2017-04-13 MED ORDER — VYVANSE 70 MG PO CAPS: 70.0000 mg | ORAL_CAPSULE | Freq: Every day | ORAL | 0 refills | Status: DC

## 2017-04-13 MED ORDER — TIZANIDINE HCL 4 MG PO CAPS: 4.0000 mg | ORAL_CAPSULE | Freq: Three times a day (TID) | ORAL | 5 refills | Status: DC | PRN

## 2017-04-13 NOTE — Progress Notes (Signed)
[facility]  PATIENT: [name] DOB: [birth year]  REFERRING DOCTOR OR PCP:  Odella Aquas SOURCE: patient and records form [facility]  _________________________________   HISTORICAL  CHIEF COMPLAINT:  Chief Complaint  Patient presents with  . Lupus    Would like to discuss adding a smaller dose of stimulant in the afternoon, when Vyvanse is wearing off.  Needs r/f of Tizanidine/fim  . Headache  . ADD  . Neck Pain  . 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    Update 04/13/2017:      She is generally feeling well.    Migraines are well controlled.  Her neck pain is also doing well.  Poor sleep may trigger one.     Vyvanse is helping her ADD a lot.   However, a lot of days, she feels focus starts to waver in the late afternoon.    Tizanidine prn is helping her sleep.   She takes 4 to 6 mg as needed.    She is active and continues to exercise.    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      She is sleeping well at night.  She has lupus and is currently on Flexeril, methotrexate and folic acid. Biologic agents have been discussed with her. She continues to have some SI joint pain but it is doing better with exercise. She has not needed to use many of her Percocets.  ______________________________ From 10/12/2016:   Migraines: Migraines are generally doing well.   She opted to not switch from Topamax to zonisamide.    She has not been taking Trokendi as much.  Sleep deprivation can trigger one.     Some migraines are associated with cognitive fog  With word finding errors.     Often with the migraine, she will have neck pain associated with migraines.   Pain is mostly right sided.   She denies visual aura but if she gets a spaced out sensation as an aura, her migraines are usually worse.  Her dose of Trokendi is only 25 mg but that helps a  lot.  MRI of the brain was fine in the past  Musculoskeletal Pain/SLE:   Her worse pain is in the right buttock and SI region.  With mildly different pain, a TPI in the piriformis had helped.   She has systemic lupus erythematosus and is seen by Dr. Myrtie Neither for her lupus she is on Plaquenil and methotrexate with benefit.  The onset of SLE followed a parvovirus infection in 2012.   At that time, her ANA was positive with the subsets showing the presence of anti-DNA antibodies. ESR was also mildly elevated.  SI joint improved after PT.    Percocet helps the pain and she takes it sparingly.  She tries to exercise regularly and runs sometimes.     ADD: She has attention deficit disorder x many years.    On a test in 2011, she had poor performance in complex attentional and cognitive flexibility tasks. She has been on Vyvanse and tolerates it well. It has helped her to stay focused and to perform better at work.   REVIEW OF SYSTEMS: Constitutional: No fevers, chills, sweats, or change in appetite Eyes: No visual changes, double vision, eye pain Ear, nose and throat: No hearing loss, ear pain, nasal congestion, sore throat Cardiovascular: No chest pain, palpitations Respiratory:  No shortness of breath at rest or with exertion.   No wheezes GastrointestinaI: No nausea, vomiting, diarrhea, abdominal pain, fecal incontinence Genitourinary: No dysuria, urinary retention or frequency.  No nocturia. Musculoskeletal: as above Integumentary: No rash, pruritus, skin lesions Neurological: as above Psychiatric: No depression at this time.  No anxiety Endocrine: No palpitations, diaphoresis, change in appetite, change in weigh or increased thirst Hematologic/Lymphatic: No anemia, purpura, petechiae. Allergic/Immunologic: No itchy/runny eyes, nasal congestion, recent allergic reactions, rashes  ALLERGIES: Allergies  Allergen Reactions  . Sulfa Antibiotics     angioedema    HOME  MEDICATIONS:  Current Outpatient Prescriptions:  .  Cyanocobalamin (VITAMIN B-12 PO), Take by mouth., Disp: , Rfl:  .  cycloSPORINE (RESTASIS) 0.05 % ophthalmic emulsion, Apply to eye., Disp: , Rfl:  .  folic acid (FOLVITE) 1 MG tablet, , Disp: , Rfl: 0 .  hydroxychloroquine (PLAQUENIL) 200 MG tablet, Take by mouth daily., Disp: , Rfl:  .  Methotrexate Sodium (METHOTREXATE, PF,) 200 MG/8ML injection, Inject subcutaneous 1 cc once a week, Disp: , Rfl:  .  nystatin (MYCOSTATIN) 100000 UNIT/ML suspension, as needed. , Disp: , Rfl: 0 .  ondansetron (ZOFRAN) 4 MG tablet, Take 1 tablet (4 mg total) by mouth every 8 (eight) hours as needed for nausea or vomiting., Disp: 20 tablet, Rfl: 3 .  oxyCODONE-acetaminophen (ROXICET) 5-325 MG tablet, Take 1 tablet by mouth every 4 (four) hours as needed for severe pain., Disp: 60 tablet, Rfl: 0 .  tiZANidine (ZANAFLEX) 4 MG capsule, Take 1 capsule (4 mg total) by mouth 3 (three) times daily as needed for muscle spasms., Disp: 90 capsule, Rfl: 5 .  VYVANSE 70 MG capsule, Take 1 capsule (70 mg total) by mouth daily., Disp: 90 capsule, Rfl: 0 .  amphetamine-dextroamphetamine (ADDERALL) 10 MG tablet, One po qPM, Disp: 90 tablet, Rfl: 0 .  drospirenone-ethinyl estradiol (YASMIN,ZARAH,SYEDA) 3-0.03 MG tablet, Take 1 tablet by mouth daily., Disp: , Rfl:   PAST MEDICAL HISTORY: Past Medical History:  Diagnosis Date  . Connective tissue disease ([place])   . Fatigue   . Hypovitaminosis D   . Lupus   . Lupus   . 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 Pericarditis   . Thrush     PAST SURGICAL HISTORY: Past Surgical History:  Procedure Laterality Date  . CESAREAN SECTION    . HERNIA REPAIR    . MOUTH SURGERY  03/2016    FAMILY HISTORY: Family History  Problem Relation Age of Onset  . Lupus Unknown   . Hypertension Father   . Lupus Maternal Grandmother     SOCIAL HISTORY:  Social History   Social History  . Marital status: Married    Spouse name: N/A   . Number of children: N/A  . Years of education: N/A   Occupational History  . Not on file.   Social History Main Topics  . Smoking status: Never Smoker  . Smokeless tobacco: Never Used  . Alcohol use No  . Drug use: No  . Sexual activity: Not on file   Other Topics Concern  . Not on file   Social History Narrative   Lives at home w/ her family   Right-handed   Caffeine: cup of tea per day     PHYSICAL EXAM  Vitals:   04/13/17 1130  BP: 120/78  Resp: 14  Weight: 146 lb (66.2 kg)  Height: _0  (1.651 m)    Body mass index is 24.3 kg/m.   General:  The patient is well-developed and well-nourished and in no acute distress  Neurologic Exam  Mental status: The patient is alert and oriented x 3 at the time of the examination. The patient has apparent normal recent and remote memory, with an apparently normal attention span and concentration ability.   Speech is normal.  Cranial nerves: Extraocular movements are full.   Facial strength is normal. Trapezius strength is normal..  The tongue is midline, and the patient has symmetric elevation of the soft palate. No obvious hearing deficits are noted.  Motor:  Muscle bulk is normal.   Tone is normal. Strength is  5 / 5 in all 4 extremities.   Sensory: Sensory testing is intact to touch sensation in all 4 extremities.  Coordination: Cerebellar testing reveals good finger-nose-finger bilaterally.  Gait and station: Station is normal.   Gait is normal. Tandem gait is normal.    Reflexes: Deep tendon reflexes are symmetric and normal bilaterally.       DIAGNOSTIC DATA (LABS, IMAGING, TESTING) - I reviewed patient records, labs, notes, testing and imaging myself where available.  Lab Results  Component Value Date   WBC 9.4 07/20/2014   HGB 12.4 07/20/2014   HCT 36.3 07/20/2014   MCV 96.8 07/20/2014   PLT 297 07/20/2014      Component Value Date/Time   NA 136 07/20/2014 2250   K 4.1 07/20/2014 2250   CL 104  07/20/2014 2250   CO2 26 07/20/2014 2250   GLUCOSE 98 07/20/2014 2250   BUN 20 07/20/2014 2250   CREATININE 0.95 07/20/2014 2250   CALCIUM 9.1 07/20/2014 2250   PROT 7.7 07/20/2014 2250   ALBUMIN 3.9 07/20/2014 2250   AST 27 07/20/2014 2250   ALT 18 07/20/2014 2250   ALKPHOS 55 07/20/2014 2250   BILITOT 0.3 07/20/2014 2250   GFRNONAA 73 (L) 07/20/2014 2250   GFRAA 84 (L) 07/20/2014 2250   Lab Results  Component Value Date   CHOL 182 12/09/2010   HDL 83 12/09/2010   LDLCALC  12/09/2010    72        Total Cholesterol/HDL:CHD Risk Coronary Heart Disease Risk Table                     Men   Women  1/2 Average Risk   3.4   3.3  Average Risk       5.0   4.4  2 X Average Risk   9.6   7.1  3 X Average Risk  23.4   11.0        Use the calculated Patient Ratio above and the CHD Risk Table to determine the patient's CHD Risk.        ATP III CLASSIFICATION (LDL):  <100     mg/dL   Optimal  100-129  mg/dL   Near or Above                    Optimal  130-159  mg/dL   Borderline  160-189  mg/dL   High  >190     mg/dL   Very High   TRIG 133 12/09/2010   CHOLHDL 2.2 12/09/2010   Lab Results  Component Value Date   HGBA1C  12/08/2010    5.5 (NOTE)  According to the ADA Clinical Practice Recommendations for 2011, when HbA1c is used as a screening test:   >=6.5%   Diagnostic of Diabetes Mellitus           (if abnormal result  is confirmed)  5.7-6.4%   Increased risk of developing Diabetes Mellitus  References:Diagnosis and Classification of Diabetes Mellitus,Diabetes Care,2011,34(Suppl 1):S62-S69 and Standards of Medical Care in         Diabetes - 2011,Diabetes ZDGU,4403,47  (Suppl 1):S11-S61.      ASSESSMENT AND PLAN  Migraine without aura and without status migrainosus, not intractable  Systemic lupus erythematosus, unspecified SLE type, unspecified organ involvement status ([facility])  Sacroiliac joint  pain  Neck pain  Attention deficit disorder (ADD) without hyperactivity   1.   Refill Vyvanse 70 mg daily.  Add when necessary Adderall every afternoon. 2.   If pain worsens, we can always do another trigger point injection. 3.   Return to clinic in 6 months or sooner if there are new or worsening neurologic symptoms.   Georgena Weisheit A. Felecia Shelling, MD, PhD 42/11/9561, 87:56 PM Certified in Neurology, Clinical Neurophysiology, Sleep Medicine, Pain Medicine and Neuroimaging  [facility] [address] [place], [place] [address] [phone]

## 2017-08-02 ENCOUNTER — Other Ambulatory Visit: Payer: Self-pay | Admitting: Neurology

## 2017-08-02 MED ORDER — VYVANSE 70 MG PO CAPS: 70.0000 mg | ORAL_CAPSULE | Freq: Every day | ORAL | 0 refills | Status: DC

## 2017-08-02 NOTE — Telephone Encounter (Signed)
Last OV and last fill 04/13/17. Rx printed, awaiting sig.

## 2017-08-02 NOTE — Telephone Encounter (Signed)
Pt request refill for VYVANSE 70 MG capsule

## 2017-08-03 MED ORDER — AMPHETAMINE-DEXTROAMPHETAMINE 10 MG PO TABS: ORAL_TABLET | ORAL | 0 refills | Status: DC

## 2017-08-03 NOTE — Telephone Encounter (Signed)
Rx signed and at the front 

## 2017-08-03 NOTE — Addendum Note (Signed)
Addended by: Rocky LinkGANN, Bridgid Printz A on: 08/03/2017 01:30 PM   Modules accepted: Orders

## 2017-10-19 ENCOUNTER — Encounter: Payer: Self-pay | Admitting: Neurology

## 2017-10-19 ENCOUNTER — Ambulatory Visit: Payer: No Typology Code available for payment source | Admitting: Neurology

## 2017-10-19 VITALS — BP 122/81 | HR 78 | Resp 14 | Ht 65.0 in | Wt 147.0 lb

## 2017-10-19 DIAGNOSIS — M329 Systemic lupus erythematosus, unspecified: Secondary | ICD-10-CM | POA: Diagnosis not present

## 2017-10-19 DIAGNOSIS — F988 Other specified behavioral and emotional disorders with onset usually occurring in childhood and adolescence: Secondary | ICD-10-CM | POA: Diagnosis not present

## 2017-10-19 DIAGNOSIS — G43009 Migraine without aura, not intractable, without status migrainosus: Secondary | ICD-10-CM | POA: Diagnosis not present

## 2017-10-19 DIAGNOSIS — M533 Sacrococcygeal disorders, not elsewhere classified: Secondary | ICD-10-CM | POA: Diagnosis not present

## 2017-10-19 MED ORDER — VYVANSE 70 MG PO CAPS: 70.0000 mg | ORAL_CAPSULE | Freq: Every day | ORAL | 0 refills | Status: DC

## 2017-10-19 MED ORDER — AMPHETAMINE-DEXTROAMPHETAMINE 10 MG PO TABS: ORAL_TABLET | ORAL | 0 refills | Status: DC

## 2017-10-19 NOTE — Progress Notes (Addendum)
[facility]  PATIENT: [name] DOB: January 08, 1971  REFERRING DOCTOR OR PCP:  Odella Aquas SOURCE: patient and records form [facility]  _________________________________   HISTORICAL  CHIEF COMPLAINT:  Chief Complaint  Patient presents with  . Lupus    Sts. h/a's have been minimal.  Sts. back pain is improved since piriformis inj. at last ov.  Needs a r/f of Vyvanse/fim  . Headache  . Back Pain  . Neck Pain  . ADD  . 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    Update 10/19/2017: She is doing well in general.  The ADD is well controlled most days with Vyvanse 70 mg plus Adderall 10 mg in the afternoon.     She still notes some decreased processing at times.  Migraines are doing well.   Sometimes she gets an unusual sensation in her forehead associated with dissociative feelings.   Her LBP and SI/piroiformis pain improved after the TPI.    Her lupus is doing ok but joint pain has been worse and a biologic is being considered.   She is thinking about this but concerned about the potential risks including PML.  Insomnia has done well most nights.  Update 04/13/2017:      She is generally feeling well.    Migraines are well controlled.  Her neck pain is also doing well.  Poor sleep may trigger one.     Vyvanse is helping her ADD a lot.   However, a lot of days, she feels focus starts to waver in the late afternoon.    Tizanidine prn is helping her sleep.   She takes 4 to 6 mg as needed.    She is active and continues to exercise.    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      She is sleeping well at night.  She has lupus and is currently on Flexeril, methotrexate and folic acid. Biologic agents have been discussed with her. She continues to have some SI joint pain but it is doing better with exercise. She has not needed to use many of her  Percocets.  ______________________________ From 10/12/2016:   Migraines: Migraines are generally doing well.   She opted to not switch from Topamax to zonisamide.    She has not been taking Trokendi as much.  Sleep deprivation can trigger one.     Some migraines are associated with cognitive fog  With word finding errors.     Often with the migraine, she will have neck pain associated with migraines.   Pain is mostly right sided.   She denies visual aura but if she gets a spaced out sensation as an aura, her migraines are usually worse.  Her dose of Trokendi is only 25 mg but that helps a lot.  MRI of the brain was fine in the past  Musculoskeletal Pain/SLE:   Her worse pain is in the right buttock and SI region.  With mildly different pain, a TPI in the piriformis had helped.   She has systemic lupus erythematosus and is seen by Dr. Myrtie Neither for her lupus she is on Plaquenil and methotrexate with benefit.  The onset of SLE followed a parvovirus infection in 2012.   At that time, her ANA was positive with the subsets showing the presence of anti-DNA antibodies. ESR was also mildly elevated.  SI joint improved after  PT.    Percocet helps the pain and she takes it sparingly.  She tries to exercise regularly and runs sometimes.     ADD: She has attention deficit disorder x many years.    On a test in 2011, she had poor performance in complex attentional and cognitive flexibility tasks. She has been on Vyvanse and tolerates it well. It has helped her to stay focused and to perform better at work.   REVIEW OF SYSTEMS: Constitutional: No fevers, chills, sweats, or change in appetite Eyes: No visual changes, double vision, eye pain Ear, nose and throat: No hearing loss, ear pain, nasal congestion, sore throat Cardiovascular: No chest pain, palpitations Respiratory: No shortness of breath at rest or with exertion.   No wheezes GastrointestinaI: No nausea, vomiting, diarrhea, abdominal pain, fecal  incontinence Genitourinary: No dysuria, urinary retention or frequency.  No nocturia. Musculoskeletal: as above Integumentary: No rash, pruritus, skin lesions Neurological: as above Psychiatric: No depression at this time.  No anxiety Endocrine: No palpitations, diaphoresis, change in appetite, change in weigh or increased thirst Hematologic/Lymphatic: No anemia, purpura, petechiae. Allergic/Immunologic: No itchy/runny eyes, nasal congestion, recent allergic reactions, rashes  ALLERGIES: Allergies  Allergen Reactions  . Sulfa Antibiotics     angioedema    HOME MEDICATIONS:  Current Outpatient Medications:  .  amphetamine-dextroamphetamine (ADDERALL) 10 MG tablet, One po qPM, Disp: 90 tablet, Rfl: 0 .  Cyanocobalamin (VITAMIN B-12 PO), Take by mouth., Disp: , Rfl:  .  cycloSPORINE (RESTASIS) 0.05 % ophthalmic emulsion, Apply to eye., Disp: , Rfl:  .  folic acid (FOLVITE) 1 MG tablet, , Disp: , Rfl: 0 .  hydroxychloroquine (PLAQUENIL) 200 MG tablet, Take by mouth daily., Disp: , Rfl:  .  Methotrexate Sodium (METHOTREXATE, PF,) 200 MG/8ML injection, Inject subcutaneous 1 cc once a week, Disp: , Rfl:  .  nystatin (MYCOSTATIN) 100000 UNIT/ML suspension, as needed. , Disp: , Rfl: 0 .  ondansetron (ZOFRAN) 4 MG tablet, Take 1 tablet (4 mg total) by mouth every 8 (eight) hours as needed for nausea or vomiting., Disp: 20 tablet, Rfl: 3 .  oxyCODONE-acetaminophen (ROXICET) 5-325 MG tablet, Take 1 tablet by mouth every 4 (four) hours as needed for severe pain., Disp: 60 tablet, Rfl: 0 .  tiZANidine (ZANAFLEX) 4 MG capsule, Take 1 capsule (4 mg total) by mouth 3 (three) times daily as needed for muscle spasms., Disp: 90 capsule, Rfl: 5 .  VYVANSE 70 MG capsule, Take 1 capsule (70 mg total) by mouth daily., Disp: 90 capsule, Rfl: 0 .  drospirenone-ethinyl estradiol (YASMIN,ZARAH,SYEDA) 3-0.03 MG tablet, Take 1 tablet by mouth daily., Disp: , Rfl:   PAST MEDICAL HISTORY: Past Medical History:    Diagnosis Date  . Connective tissue disease ([place])   . Fatigue   . Hypovitaminosis D   . Lupus ([place])   . Lupus ([place])   . 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 Pericarditis   . Thrush     PAST SURGICAL HISTORY: Past Surgical History:  Procedure Laterality Date  . CESAREAN SECTION    . HERNIA REPAIR    . MOUTH SURGERY  03/2016    FAMILY HISTORY: Family History  Problem Relation Age of Onset  . Lupus Unknown   . Hypertension Father   . Lupus Maternal Grandmother     SOCIAL HISTORY:  Social History   Socioeconomic History  . Marital status: Married    Spouse name: Not on file  . Number of children: Not on file  .  Years of education: Not on file  . Highest education level: Not on file  Occupational History  . Not on file  Social Needs  . Financial resource strain: Not on file  . Food insecurity:    Worry: Not on file    Inability: Not on file  . Transportation needs:    Medical: Not on file    Non-medical: Not on file  Tobacco Use  . Smoking status: Never Smoker  . Smokeless tobacco: Never Used  Substance and Sexual Activity  . Alcohol use: No  . Drug use: No  . Sexual activity: Not on file  Lifestyle  . Physical activity:    Days per week: Not on file    Minutes per session: Not on file  . Stress: Not on file  Relationships  . Social connections:    Talks on phone: Not on file    Gets together: Not on file    Attends religious service: Not on file    Active member of club or organization: Not on file    Attends meetings of clubs or organizations: Not on file    Relationship status: Not on file  . Intimate partner violence:    Fear of current or ex partner: Not on file    Emotionally abused: Not on file    Physically abused: Not on file    Forced sexual activity: Not on file  Other Topics Concern  . Not on file  Social History Narrative   Lives at home w/ her family   Right-handed   Caffeine: cup of tea per day     PHYSICAL EXAM  Vitals:    10/19/17 0833  BP: 122/81  Pulse: 78  Resp: 14  Weight: 147 lb (66.7 kg)  Height: 5' 5"  (1.651 m)    Body mass index is 24.46 kg/m.   General: The patient is well-developed and well-nourished and in no acute distress  Neurologic Exam  Mental status: The patient is alert and oriented x 3 at the time of the examination. The patient has apparent normal recent and remote memory, with an apparently normal attention span and concentration ability.   Speech is normal.  Cranial nerves: Extraocular movements are full.   Facial strength is normal.    Motor:  Muscle bulk is normal.   Tone is normal. Strength is  5 / 5 in all 4 extremities.   Sensory: Sensory testing is intact to touch sensation in all 4 extremities.   Gait and station: Station is normal.   Gait is normal. Tandem gait is normal.    Reflexes: Deep tendon reflexes are symmetric and normal bilaterally.       DIAGNOSTIC DATA (LABS, IMAGING, TESTING) - I reviewed patient records, labs, notes, testing and imaging myself where available.  Lab Results  Component Value Date   WBC 9.4 07/20/2014   HGB 12.4 07/20/2014   HCT 36.3 07/20/2014   MCV 96.8 07/20/2014   PLT 297 07/20/2014      Component Value Date/Time   NA 136 07/20/2014 2250   K 4.1 07/20/2014 2250   CL 104 07/20/2014 2250   CO2 26 07/20/2014 2250   GLUCOSE 98 07/20/2014 2250   BUN 20 07/20/2014 2250   CREATININE 0.95 07/20/2014 2250   CALCIUM 9.1 07/20/2014 2250   PROT 7.7 07/20/2014 2250   ALBUMIN 3.9 07/20/2014 2250   AST 27 07/20/2014 2250   ALT 18 07/20/2014 2250   ALKPHOS 55 07/20/2014 2250   BILITOT 0.3  07/20/2014 2250   GFRNONAA 73 (L) 07/20/2014 2250   GFRAA 84 (L) 07/20/2014 2250   Lab Results  Component Value Date   CHOL 182 12/09/2010   HDL 83 12/09/2010   LDLCALC  12/09/2010    72        Total Cholesterol/HDL:CHD Risk Coronary Heart Disease Risk Table                     Men   Women  1/2 Average Risk   3.4   3.3  Average Risk        5.0   4.4  2 X Average Risk   9.6   7.1  3 X Average Risk  23.4   11.0        Use the calculated Patient Ratio above and the CHD Risk Table to determine the patient's CHD Risk.        ATP III CLASSIFICATION (LDL):  <100     mg/dL   Optimal  100-129  mg/dL   Near or Above                    Optimal  130-159  mg/dL   Borderline  160-189  mg/dL   High  >190     mg/dL   Very High   TRIG 133 12/09/2010   CHOLHDL 2.2 12/09/2010   Lab Results  Component Value Date   HGBA1C  12/08/2010    5.5 (NOTE)                                                                       According to the ADA Clinical Practice Recommendations for 2011, when HbA1c is used as a screening test:   >=6.5%   Diagnostic of Diabetes Mellitus           (if abnormal result  is confirmed)  5.7-6.4%   Increased risk of developing Diabetes Mellitus  References:Diagnosis and Classification of Diabetes Mellitus,Diabetes Care,2011,34(Suppl 1):S62-S69 and Standards of Medical Care in         Diabetes - 2011,Diabetes Care,2011,34  (Suppl 1):S11-S61.      ASSESSMENT AND PLAN  Attention deficit disorder (ADD) without hyperactivity  Sacroiliac joint pain  Systemic lupus erythematosus, unspecified SLE type, unspecified organ involvement status ([place])  Migraine without aura and without status migrainosus, not intractable   1.   Refill Vyvanse 70 mg daily. And PRN Adderall every afternoon. 2.   Patient is currently doing well, consider repeat trigger point injection if it flares again. 3.   Continue to exercise and be active. 4.   Return to clinic in 6 months or sooner if there are new or worsening neurologic symptoms.   Richard A. Felecia Shelling, MD, PhD 7/90/2409, 73:53 AM Certified in Neurology, Clinical Neurophysiology, Sleep Medicine, Pain Medicine and Neuroimaging  [facility] [address], [place], [place] [address] [phone]

## 2018-02-22 ENCOUNTER — Other Ambulatory Visit: Payer: Self-pay | Admitting: Neurology

## 2018-02-22 MED ORDER — AMPHETAMINE-DEXTROAMPHETAMINE 10 MG PO TABS
ORAL_TABLET | ORAL | 0 refills | Status: DC
Start: 2018-02-22 — End: 2018-06-13

## 2018-02-22 MED ORDER — VYVANSE 70 MG PO CAPS: 70.0000 mg | ORAL_CAPSULE | Freq: Every day | ORAL | 0 refills | Status: DC

## 2018-02-22 NOTE — Telephone Encounter (Signed)
Pt requesting refills for amphetamine-dextroamphetamine (ADDERALL) 10 MG tablet and VYVANSE 70 MG capsule [facility]

## 2018-04-26 ENCOUNTER — Other Ambulatory Visit: Payer: Self-pay

## 2018-04-26 ENCOUNTER — Ambulatory Visit: Payer: PRIVATE HEALTH INSURANCE | Admitting: Neurology

## 2018-04-26 ENCOUNTER — Encounter: Payer: Self-pay | Admitting: Neurology

## 2018-04-26 VITALS — BP 105/60 | HR 62 | Ht 65.0 in | Wt 150.5 lb

## 2018-04-26 DIAGNOSIS — M533 Sacrococcygeal disorders, not elsewhere classified: Secondary | ICD-10-CM | POA: Diagnosis not present

## 2018-04-26 DIAGNOSIS — G43009 Migraine without aura, not intractable, without status migrainosus: Secondary | ICD-10-CM

## 2018-04-26 DIAGNOSIS — F988 Other specified behavioral and emotional disorders with onset usually occurring in childhood and adolescence: Secondary | ICD-10-CM | POA: Diagnosis not present

## 2018-04-26 DIAGNOSIS — M329 Systemic lupus erythematosus, unspecified: Secondary | ICD-10-CM

## 2018-04-26 NOTE — Progress Notes (Signed)
[facility]  PATIENT: [name] DOB: August 20, 1970  REFERRING DOCTOR OR PCP:  Odella Aquas SOURCE: patient and records form [facility]  _________________________________   HISTORICAL  CHIEF COMPLAINT:  Chief Complaint  Patient presents with  . Follow-up    RM 12, alone. Last seen 10/19/17. Taking vyvanse, adderral for ADD. Reports no new sx.  . Migraine    Doing well. Migraines stable.    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    Update 04/26/2018: The stimulants have helped her ADD a lot.   Vyvanse usually wears off by mid afternoon so she adds immediate release Adderall.     She tolerates them well.  No difficulty with appetite or sleep.  Her SLE is stable but some changes in med's are being considered.   She stays active and exercises regularly.    She has a lot of achiness in some joints and muscles.    She rarely has to take a Percocet  Her migraines are doing well.   She rarely has one.    She will take a Percocet if the headache is more severe but seldom more than a couple months        Update 10/19/2017: She is doing well in general.  The ADD is well controlled most days with Vyvanse 70 mg plus Adderall 10 mg in the afternoon.     She still notes some decreased processing at times.  Migraines are doing well.   Sometimes she gets an unusual sensation in her forehead associated with dissociative feelings.   Her LBP and SI/piroiformis pain improved after the TPI.    Her lupus is doing ok but joint pain has been worse and a biologic is being considered.   She is thinking about this but concerned about the potential risks including PML.  Insomnia has done well most nights.  Update 04/13/2017:      She is generally feeling well.    Migraines are well controlled.  Her neck pain is also doing well.  Poor sleep may trigger one.     Vyvanse is helping  her ADD a lot.   However, a lot of days, she feels focus starts to waver in the late afternoon.    Tizanidine prn is helping her sleep.   She takes 4 to 6 mg as needed.    She is active and continues to exercise.    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      She is sleeping well at night.  She has lupus and is currently on Flexeril, methotrexate and folic acid. Biologic agents have been discussed with her. She continues to have some SI joint pain but it is doing better with exercise. She has not needed to use many of her Percocets.  ______________________________ From 10/12/2016:   Migraines: Migraines are generally doing well.   She opted to not switch from Topamax to zonisamide.    She has not been taking Trokendi as much.  Sleep deprivation can trigger one.     Some migraines are associated with cognitive fog  With word finding errors.     Often with the migraine, she will have neck pain associated with migraines.   Pain is mostly right sided.   She denies visual aura but if she gets a spaced out sensation as an aura, her migraines are usually worse.  Her dose of  Trokendi is only 25 mg but that helps a lot.  MRI of the brain was fine in the past  Musculoskeletal Pain/SLE:   Her worse pain is in the right buttock and SI region.  With mildly different pain, a TPI in the piriformis had helped.   She has systemic lupus erythematosus and is seen by Dr. Myrtie Neither for her lupus she is on Plaquenil and methotrexate with benefit.  The onset of SLE followed a parvovirus infection in 2012.   At that time, her ANA was positive with the subsets showing the presence of anti-DNA antibodies. ESR was also mildly elevated.  SI joint improved after PT.    Percocet helps the pain and she takes it sparingly.  She tries to exercise regularly and runs sometimes.     ADD: She has attention deficit disorder x many years.    On a test in 2011, she had poor performance in complex attentional and cognitive flexibility tasks.  She has been on Vyvanse and tolerates it well. It has helped her to stay focused and to perform better at work.   REVIEW OF SYSTEMS: Constitutional: No fevers, chills, sweats, or change in appetite Eyes: No visual changes, double vision, eye pain Ear, nose and throat: No hearing loss, ear pain, nasal congestion, sore throat Cardiovascular: No chest pain, palpitations Respiratory: No shortness of breath at rest or with exertion.   No wheezes GastrointestinaI: No nausea, vomiting, diarrhea, abdominal pain, fecal incontinence Genitourinary: No dysuria, urinary retention or frequency.  No nocturia. Musculoskeletal: as above Integumentary: No rash, pruritus, skin lesions Neurological: as above Psychiatric: No depression at this time.  No anxiety Endocrine: No palpitations, diaphoresis, change in appetite, change in weigh or increased thirst Hematologic/Lymphatic: No anemia, purpura, petechiae. Allergic/Immunologic: No itchy/runny eyes, nasal congestion, recent allergic reactions, rashes  ALLERGIES: Allergies  Allergen Reactions  . Sulfa Antibiotics     angioedema    HOME MEDICATIONS:  Current Outpatient Medications:  .  amphetamine-dextroamphetamine (ADDERALL) 10 MG tablet, One po qPM, Disp: 90 tablet, Rfl: 0 .  Cyanocobalamin (VITAMIN B-12 PO), Take by mouth., Disp: , Rfl:  .  cycloSPORINE (RESTASIS) 0.05 % ophthalmic emulsion, Apply to eye., Disp: , Rfl:  .  folic acid (FOLVITE) 1 MG tablet, , Disp: , Rfl: 0 .  hydroxychloroquine (PLAQUENIL) 200 MG tablet, Take by mouth daily., Disp: , Rfl:  .  Methotrexate Sodium (METHOTREXATE, PF,) 200 MG/8ML injection, Inject subcutaneous 1 cc once a week, Disp: , Rfl:  .  nystatin (MYCOSTATIN) 100000 UNIT/ML suspension, as needed. , Disp: , Rfl: 0 .  ondansetron (ZOFRAN) 4 MG tablet, Take 1 tablet (4 mg total) by mouth every 8 (eight) hours as needed for nausea or vomiting., Disp: 20 tablet, Rfl: 3 .  oxyCODONE-acetaminophen (ROXICET) 5-325  MG tablet, Take 1 tablet by mouth every 4 (four) hours as needed for severe pain., Disp: 60 tablet, Rfl: 0 .  tiZANidine (ZANAFLEX) 4 MG capsule, Take 1 capsule (4 mg total) by mouth 3 (three) times daily as needed for muscle spasms., Disp: 90 capsule, Rfl: 5 .  VYVANSE 70 MG capsule, Take 1 capsule (70 mg total) by mouth daily., Disp: 90 capsule, Rfl: 0 .  drospirenone-ethinyl estradiol (YASMIN,ZARAH,SYEDA) 3-0.03 MG tablet, Take 1 tablet by mouth daily., Disp: , Rfl:   PAST MEDICAL HISTORY: Past Medical History:  Diagnosis Date  . Connective tissue disease ([place])   . Fatigue   . Hypovitaminosis D   . Lupus ([place])   . Lupus ([place])   .  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 Pericarditis   . Thrush     PAST SURGICAL HISTORY: Past Surgical History:  Procedure Laterality Date  . CESAREAN SECTION    . HERNIA REPAIR    . MOUTH SURGERY  03/2016    FAMILY HISTORY: Family History  Problem Relation Age of Onset  . Hypertension Father   . Lupus Maternal Grandmother   . Lupus Unknown     SOCIAL HISTORY:  Social History   Socioeconomic History  . Marital status: Married    Spouse name: Not on file  . Number of children: Not on file  . Years of education: Not on file  . Highest education level: Not on file  Occupational History  . Not on file  Social Needs  . Financial resource strain: Not on file  . Food insecurity:    Worry: Not on file    Inability: Not on file  . Transportation needs:    Medical: Not on file    Non-medical: Not on file  Tobacco Use  . Smoking status: Never Smoker  . Smokeless tobacco: Never Used  Substance and Sexual Activity  . Alcohol use: No  . Drug use: No  . Sexual activity: Not on file  Lifestyle  . Physical activity:    Days per week: Not on file    Minutes per session: Not on file  . Stress: Not on file  Relationships  . Social connections:    Talks on phone: Not on file    Gets together: Not on file    Attends religious service: Not on  file    Active member of club or organization: Not on file    Attends meetings of clubs or organizations: Not on file    Relationship status: Not on file  . Intimate partner violence:    Fear of current or ex partner: Not on file    Emotionally abused: Not on file    Physically abused: Not on file    Forced sexual activity: Not on file  Other Topics Concern  . Not on file  Social History Narrative   Lives at home w/ her family   Right-handed   Caffeine: cup of tea per day     PHYSICAL EXAM  Vitals:   04/26/18 0825  BP: 105/60  Pulse: 62  Weight: 150 lb 8 oz (68.3 kg)  Height: 5' 5"  (1.651 m)    Body mass index is 25.04 kg/m.   General: The patient is well-developed and well-nourished and in no acute distress.   He has some tenderness over the SI joints.  Neurologic Exam  Mental status: The patient is alert and oriented x 3 at the time of the examination. The patient has apparent normal recent and remote memory, with an apparently normal attention span and concentration ability.   Speech is normal.  Cranial nerves: Extraocular movements are full.  Facial strength is normal.  Motor:  Muscle bulk is normal.   Tone is normal. Strength is  5 / 5 in all 4 extremities.   Sensory: Sensory testing is intact to touch sensation in all 4 extremities.   Gait and station: Station is normal.   The gait and the tandem gait are normal. Reflexes: Deep tendon reflexes are symmetric and normal bilaterally.       DIAGNOSTIC DATA (LABS, IMAGING, TESTING) - I reviewed patient records, labs, notes, testing and imaging myself where available.  Lab Results  Component Value Date   WBC  9.4 07/20/2014   HGB 12.4 07/20/2014   HCT 36.3 07/20/2014   MCV 96.8 07/20/2014   PLT 297 07/20/2014      Component Value Date/Time   NA 136 07/20/2014 2250   K 4.1 07/20/2014 2250   CL 104 07/20/2014 2250   CO2 26 07/20/2014 2250   GLUCOSE 98 07/20/2014 2250   BUN 20 07/20/2014 2250    CREATININE 0.95 07/20/2014 2250   CALCIUM 9.1 07/20/2014 2250   PROT 7.7 07/20/2014 2250   ALBUMIN 3.9 07/20/2014 2250   AST 27 07/20/2014 2250   ALT 18 07/20/2014 2250   ALKPHOS 55 07/20/2014 2250   BILITOT 0.3 07/20/2014 2250   GFRNONAA 73 (L) 07/20/2014 2250   GFRAA 84 (L) 07/20/2014 2250   Lab Results  Component Value Date   CHOL 182 12/09/2010   HDL 83 12/09/2010   LDLCALC  12/09/2010    72        Total Cholesterol/HDL:CHD Risk Coronary Heart Disease Risk Table                     Men   Women  1/2 Average Risk   3.4   3.3  Average Risk       5.0   4.4  2 X Average Risk   9.6   7.1  3 X Average Risk  23.4   11.0        Use the calculated Patient Ratio above and the CHD Risk Table to determine the patient's CHD Risk.        ATP III CLASSIFICATION (LDL):  <100     mg/dL   Optimal  100-129  mg/dL   Near or Above                    Optimal  130-159  mg/dL   Borderline  160-189  mg/dL   High  >190     mg/dL   Very High   TRIG 133 12/09/2010   CHOLHDL 2.2 12/09/2010   Lab Results  Component Value Date   HGBA1C  12/08/2010    5.5 (NOTE)                                                                       According to the ADA Clinical Practice Recommendations for 2011, when HbA1c is used as a screening test:   >=6.5%   Diagnostic of Diabetes Mellitus           (if abnormal result  is confirmed)  5.7-6.4%   Increased risk of developing Diabetes Mellitus  References:Diagnosis and Classification of Diabetes Mellitus,Diabetes Care,2011,34(Suppl 1):S62-S69 and Standards of Medical Care in         Diabetes - 2011,Diabetes Care,2011,34  (Suppl 1):S11-S61.      ASSESSMENT AND PLAN  Systemic lupus erythematosus, unspecified SLE type, unspecified organ involvement status ([facility])  Attention deficit disorder (ADD) without hyperactivity  Migraine without aura and without status migrainosus, not intractable  Sacroiliac joint pain   1.   She will continue Vyvanse 70 mg every  morning and add Adderall as needed in the afternoon.   2.   Pain is currently doing well.  If it worsens we could consider a trigger  point injection again. 3.   Continue to exercise and be active. 4.   Return to clinic in 6 months or sooner if there are new or worsening neurologic symptoms.   Karis Emig A. Felecia Shelling, MD, PhD 35/32/9924, 26:83 PM Certified in Neurology, Clinical Neurophysiology, Sleep Medicine, Pain Medicine and Neuroimaging  [facility] [address], [place], [place] [address] [phone]

## 2018-06-13 ENCOUNTER — Other Ambulatory Visit: Payer: Self-pay | Admitting: Neurology

## 2018-06-13 MED ORDER — AMPHETAMINE-DEXTROAMPHETAMINE 10 MG PO TABS: ORAL_TABLET | ORAL | 0 refills | Status: DC

## 2018-06-13 MED ORDER — VYVANSE 70 MG PO CAPS: 70.0000 mg | ORAL_CAPSULE | Freq: Every day | ORAL | 0 refills | Status: DC

## 2018-06-19 IMAGING — CT CT ABD-PELV W/ CM
2 of 5 series · 16 of 46 positions shown, 18 images · IV contrast (APPLIED)
Comparison: Abdominal radiograph dated 12/23/10

CLINICAL DATA: 45-year-old female with right lower quadrant
abdominal pain. Concern for acute appendicitis.

EXAM:
CT ABDOMEN AND PELVIS WITH CONTRAST
TECHNIQUE: Multidetector CT imaging of the abdomen and pelvis was performed
using the standard protocol following bolus administration of
intravenous contrast.
CONTRAST:  100mL 9A5N0N-3FF IOPAMIDOL (9A5N0N-3FF) INJECTION 61%

[Series 2: axial st · axial · 0.70mm/px · z∈[-351,+44]mm · 13 of 89 slices shown, 15 images]
[im 5/89  soft-tissue]
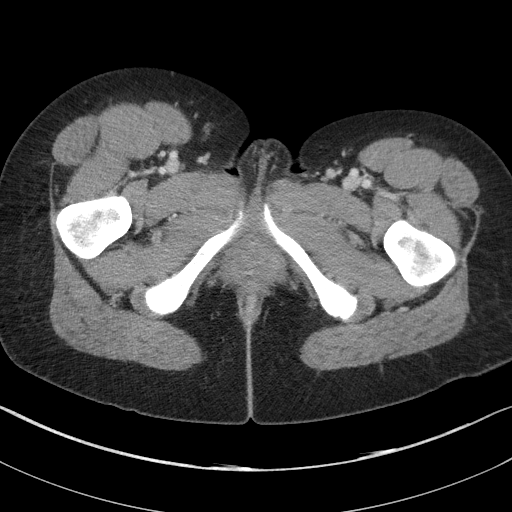
[im 5/89  bone]
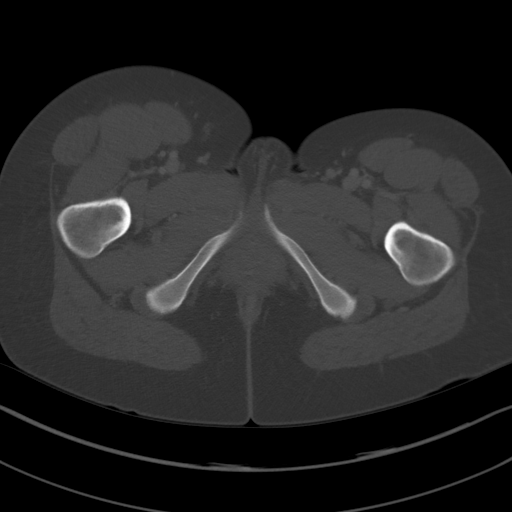
[im 10/89  soft-tissue]
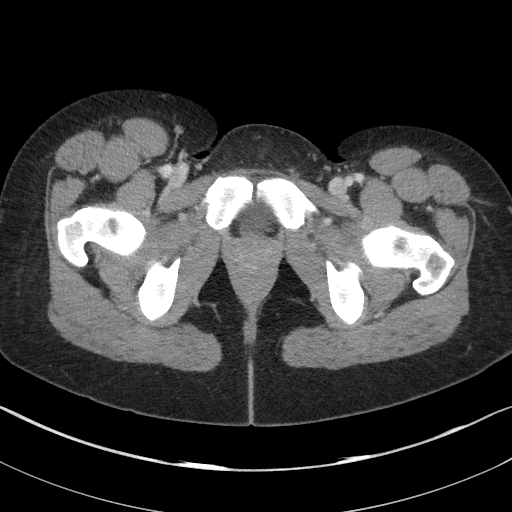
[im 20/89  soft-tissue]
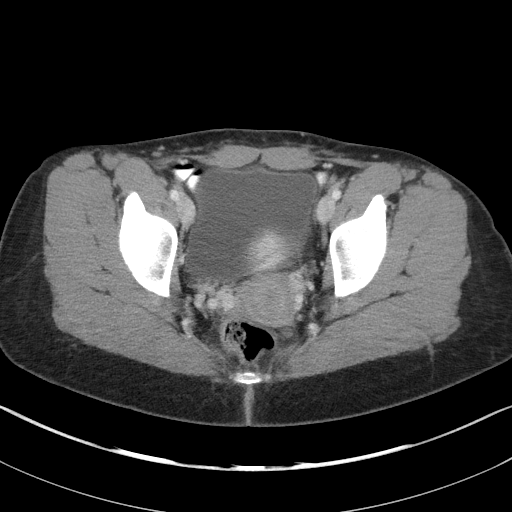
[im 25/89  soft-tissue]
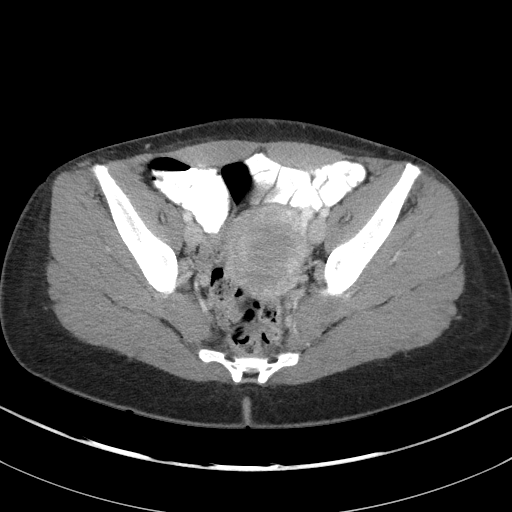
[im 30/89  soft-tissue]
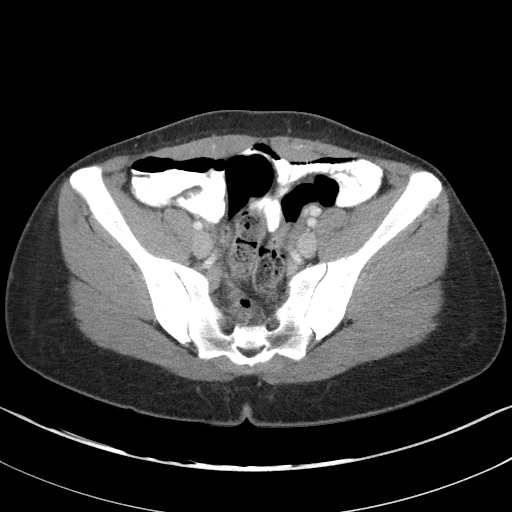
[im 40/89  soft-tissue]
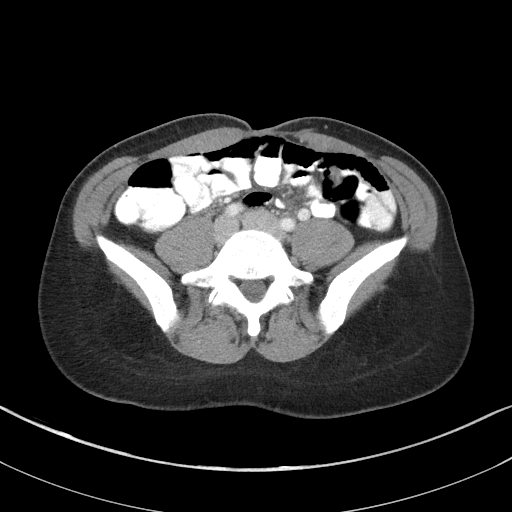
[im 45/89  soft-tissue]
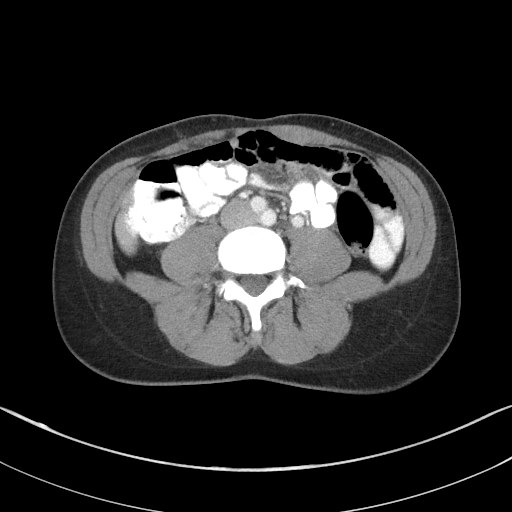
[im 49/89  soft-tissue]
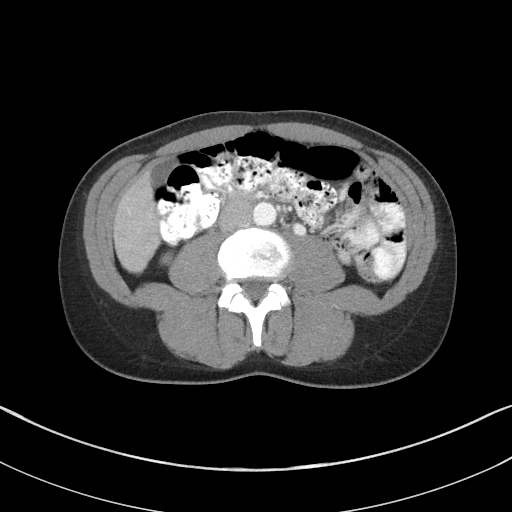
[im 59/89  soft-tissue]
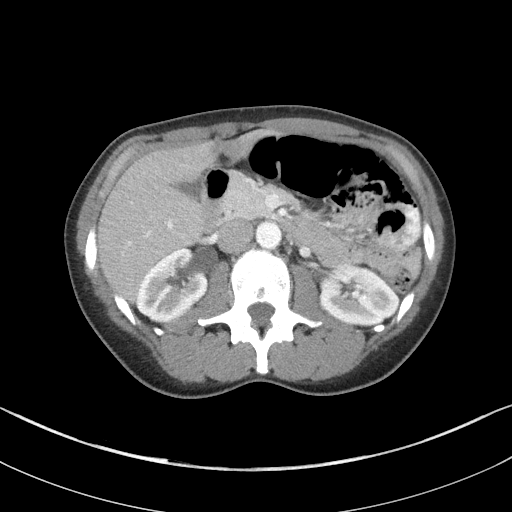
[im 59/89  bone]
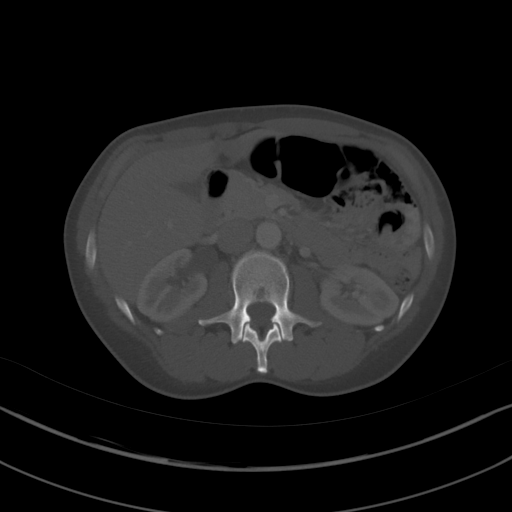
[im 64/89  soft-tissue]
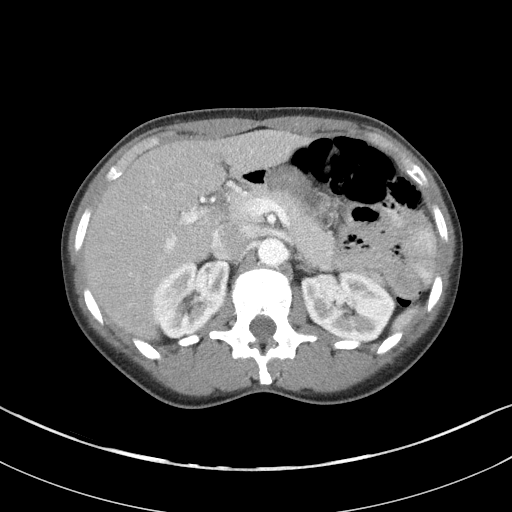
[im 69/89  soft-tissue]
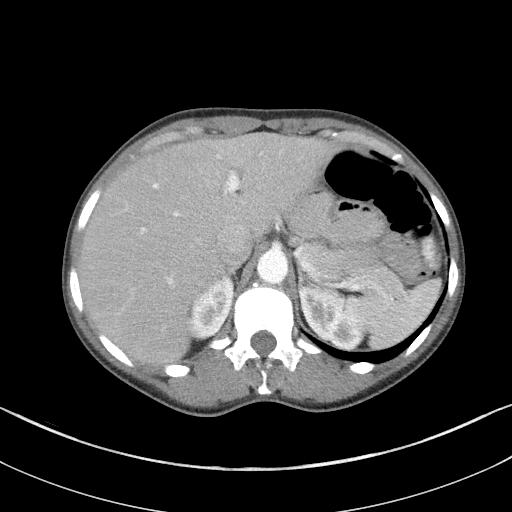
[im 79/89  soft-tissue]
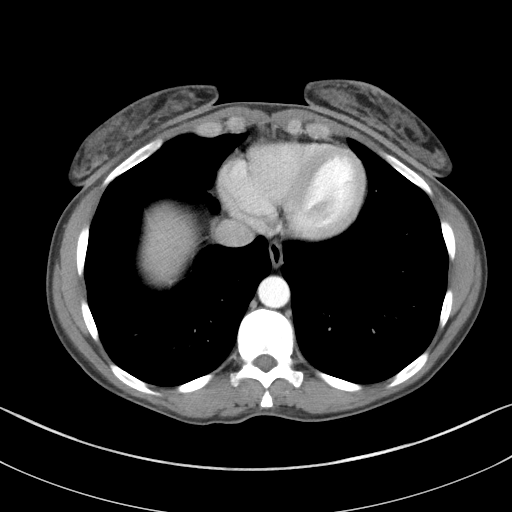
[im 84/89  soft-tissue]
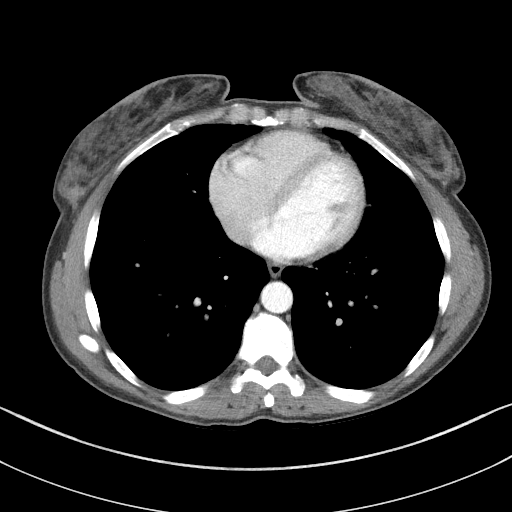

[Series 5: coronal st · coronal · 0.76mm/px · 3 of 72 slices shown]
[im 24/72  soft-tissue]
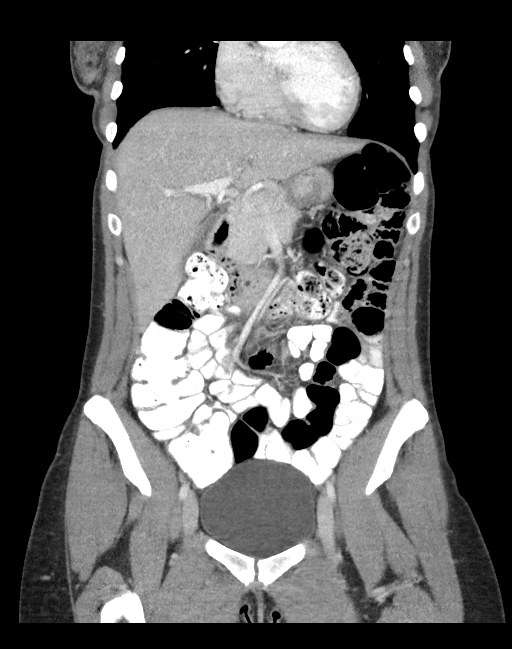
[im 32/72  soft-tissue]
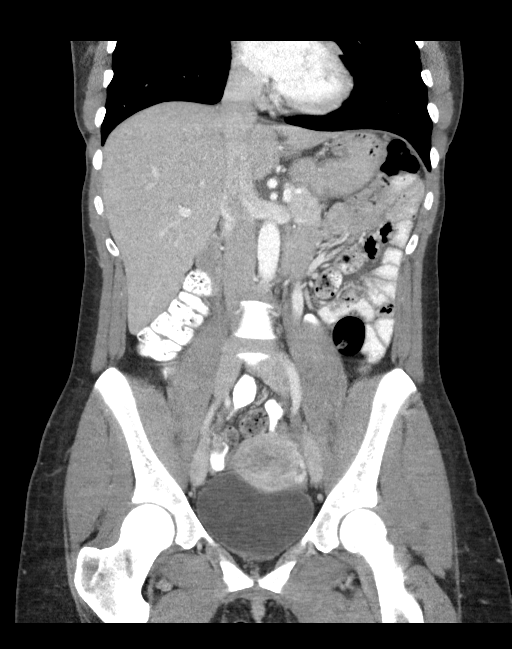
[im 40/72  soft-tissue]
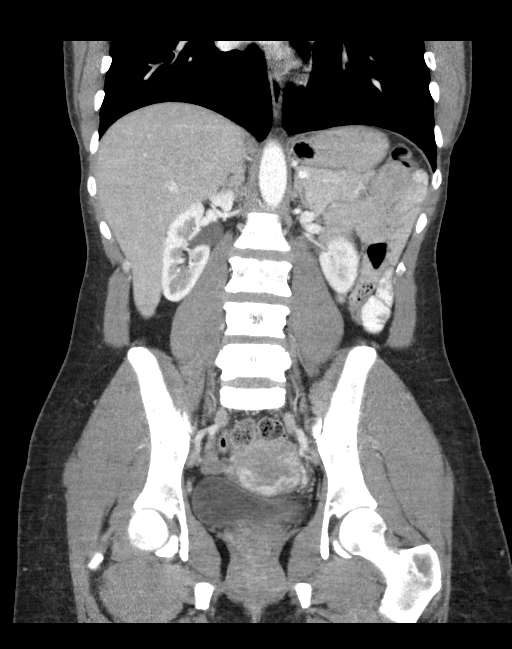

[16 of 46 positions shown; findings below may reference images not displayed]

FINDINGS: Lower chest: The visualized lung bases are clear.

No intra-abdominal free air or free fluid.

Hepatobiliary: No focal liver abnormality is seen. No gallstones,
gallbladder wall thickening, or biliary dilatation.

Pancreas: Unremarkable. No pancreatic ductal dilatation or
surrounding inflammatory changes.

Spleen: Normal in size without focal abnormality.

Adrenals/Urinary Tract: There is a right extrarenal pelvis with mild
pelviectasis. The kidneys are otherwise unremarkable. There is no
hydronephrosis on either side. The visualized ureters and urinary
bladder appear unremarkable.

Stomach/Bowel: Oral contrast opacifies multiple loops of small bowel
and traverses into the colon without evidence of obstruction. The
appendix is not visualized with certainty. A faintly visualized
small tubular appearing structure adjacent to the base of the cecum
(coronal series 5, image [DATE] represent normal appendix. There is
no inflammatory changes in the right lower quadrant or pericecal
region to suggest acute appendicitis.

Vascular/Lymphatic: No significant vascular findings are present. No
enlarged abdominal or pelvic lymph nodes.

Reproductive: The uterus is anteverted and grossly unremarkable. The
ovaries are grossly unremarkable as well.

Other: None

Musculoskeletal: No acute or significant osseous findings.
IMPRESSION: No acute intra-abdominal pelvic pathology. Nonvisualization of the
appendix. However, there is no CT findings to suggest acute
appendicitis.

## 2018-09-23 ENCOUNTER — Other Ambulatory Visit: Payer: Self-pay | Admitting: Neurology

## 2018-09-23 MED ORDER — VYVANSE 70 MG PO CAPS: 70.0000 mg | ORAL_CAPSULE | Freq: Every day | ORAL | 0 refills | Status: DC

## 2018-09-23 NOTE — Telephone Encounter (Signed)
Pt is needing a refill on her VYVANSE 70 MG capsule sent to [facility]

## 2018-10-21 ENCOUNTER — Telehealth: Payer: Self-pay | Admitting: Neurology

## 2018-10-21 NOTE — Telephone Encounter (Signed)
Pt gives consent for video visit, advised her of [employer]  email- [email or web address]

## 2018-10-24 NOTE — Telephone Encounter (Signed)
Called pt. Updated medication list, pharmacy, allergies on file. Emailed pt for virtual visit tomorrow. She confirmed receipt of email.

## 2018-10-25 ENCOUNTER — Encounter: Payer: Self-pay | Admitting: Neurology

## 2018-10-25 ENCOUNTER — Other Ambulatory Visit: Payer: Self-pay

## 2018-10-25 ENCOUNTER — Ambulatory Visit (INDEPENDENT_AMBULATORY_CARE_PROVIDER_SITE_OTHER): Payer: PRIVATE HEALTH INSURANCE | Admitting: Neurology

## 2018-10-25 DIAGNOSIS — G43009 Migraine without aura, not intractable, without status migrainosus: Secondary | ICD-10-CM | POA: Diagnosis not present

## 2018-10-25 DIAGNOSIS — M533 Sacrococcygeal disorders, not elsewhere classified: Secondary | ICD-10-CM

## 2018-10-25 DIAGNOSIS — M329 Systemic lupus erythematosus, unspecified: Secondary | ICD-10-CM

## 2018-10-25 DIAGNOSIS — F988 Other specified behavioral and emotional disorders with onset usually occurring in childhood and adolescence: Secondary | ICD-10-CM | POA: Diagnosis not present

## 2018-10-25 MED ORDER — VYVANSE 70 MG PO CAPS: 70.0000 mg | ORAL_CAPSULE | Freq: Every day | ORAL | 0 refills | Status: DC

## 2018-10-25 MED ORDER — AMPHETAMINE-DEXTROAMPHETAMINE 10 MG PO TABS: ORAL_TABLET | ORAL | 0 refills | Status: DC

## 2018-10-25 NOTE — Progress Notes (Signed)
[facility]  PATIENT: [name] DOB: 02-16-1971  REFERRING DOCTOR OR PCP:  Odella Aquas SOURCE: patient and records form [facility]  _________________________________   HISTORICAL  CHIEF COMPLAINT:  Chief Complaint  Patient presents with  . Migraine  . 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    Update 10/25/2018 Virtual Visit via Video Note I connected with [name] on 10/25/18 at  8:30 AM EDT by a video enabled telemedicine application and verified that I am speaking with the correct person.  I discussed the limitations of evaluation and management by telemedicine and the availability of in person appointments. The patient expressed understanding and agreed to proceed.  History of Present Illness: She feels that her attention deficit disorder is generally doing well.  She still has some afternoons where she feels focus begins to wane.  She occasionally takes Adderall 10 mg additionally in the afternoon with some benefit.  Some days she will just take 5 mg.  She takes Vyvanse 70 mg every morning.  We discussed the new medication, Mydayis, but for now she will stay on the Vyvanse.  Migraines are doing well and she has not had any significant ones recently.    Her lupus has been well controlled on Plaquenil and methotrexate.  Sacroiliac pain has not been much of a problem.  However, she did hurt her foot playing tennis and she is having a lot of foot pain now.  She is scheduled to see podiatry soon.  Tizanidine has helped pain when it occurs.   Observations/Objective:She is a well-developed well-nourished woman in no acute distress.  The head is normocephalic and atraumatic.  Sclera are anicteric.  Visible skin appears normal.  The neck has a good range of motion. She is alert and fully oriented with fluent speech  and good attention, knowledge and memory.  Extraocular muscles are intact.  Facial strength is normal.  Palatal elevation and tongue protrusion are midline.      Assessment and Plan: Systemic lupus erythematosus, unspecified SLE type, unspecified organ involvement status (HCC)  Attention deficit disorder (ADD) without hyperactivity  Sacroiliac joint pain  Migraine without aura and without status migrainosus, not intractable   1.  She will continue Vyvanse 70 mg in the morning and 5 to 10 mg Adderall as needed in the afternoon.  I will refill these medications. 2.  Her SI pain is doing well on her current lupus treatment and no additional medications are needed at this time.  She continues to be active.  Migraines are also doing well not needing any prophylactic medication. 3.   Return in 6 months or sooner if there are new or worsening neurologic symptoms.  Follow Up Instructions: I discussed the assessment and treatment plan with the patient. The patient was provided an opportunity to ask questions and all were answered. The patient agreed with the plan and demonstrated an understanding of the instructions.    The patient was advised to call back or seek an in-person evaluation if the symptoms worsen or if the condition fails to improve as anticipated.  I provided 25 minutes of non-face-to-face time during this encounter. ________________________________ From previous visits Update 04/26/2018: The stimulants have helped her ADD a lot.   Vyvanse usually wears off by mid afternoon so she adds immediate release Adderall.     She  tolerates them well.  No difficulty with appetite or sleep.  Her SLE is stable but some changes in med's are being considered.   She stays active and exercises regularly.    She has a lot of achiness in some joints and muscles.    She rarely has to take a Percocet  Her migraines are doing well.   She rarely has one.    She will take a Percocet if the headache is  more severe but seldom more than a couple months        Update 10/19/2017: She is doing well in general.  The ADD is well controlled most days with Vyvanse 70 mg plus Adderall 10 mg in the afternoon.     She still notes some decreased processing at times.  Migraines are doing well.   Sometimes she gets an unusual sensation in her forehead associated with dissociative feelings.   Her LBP and SI/piroiformis pain improved after the TPI.    Her lupus is doing ok but joint pain has been worse and a biologic is being considered.   She is thinking about this but concerned about the potential risks including PML.  Insomnia has done well most nights.  Update 04/13/2017:      She is generally feeling well.    Migraines are well controlled.  Her neck pain is also doing well.  Poor sleep may trigger one.     Vyvanse is helping her ADD a lot.   However, a lot of days, she feels focus starts to waver in the late afternoon.    Tizanidine prn is helping her sleep.   She takes 4 to 6 mg as needed.    She is active and continues to exercise.    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      She is sleeping well at night.  She has lupus and is currently on Flexeril, methotrexate and folic acid. Biologic agents have been discussed with her. She continues to have some SI joint pain but it is doing better with exercise. She has not needed to use many of her Percocets.  ______________________________ From 10/12/2016:   Migraines: Migraines are generally doing well.   She opted to not switch from Topamax to zonisamide.    She has not been taking Trokendi as much.  Sleep deprivation can trigger one.     Some migraines are associated with cognitive fog  With word finding errors.     Often with the migraine, she will have neck pain associated with migraines.   Pain is mostly right sided.   She denies visual aura but if she gets a spaced out sensation as an aura, her migraines are usually worse.  Her dose of Trokendi is only  25 mg but that helps a lot.  MRI of the brain was fine in the past  Musculoskeletal Pain/SLE:   Her worse pain is in the right buttock and SI region.  With mildly different pain, a TPI in the piriformis had helped.   She has systemic lupus erythematosus and is seen by Dr. Myrtie Neither for her lupus she is on Plaquenil and methotrexate with benefit.  The onset of SLE followed a parvovirus infection in 2012.   At that time, her ANA was positive with the subsets showing the presence of anti-DNA antibodies. ESR was also mildly elevated.  SI joint improved after PT.    Percocet helps the pain and she takes it sparingly.  She tries  to exercise regularly and runs sometimes.     ADD: She has attention deficit disorder x many years.    On a test in 2011, she had poor performance in complex attentional and cognitive flexibility tasks. She has been on Vyvanse and tolerates it well. It has helped her to stay focused and to perform better at work.   REVIEW OF SYSTEMS: Constitutional: No fevers, chills, sweats, or change in appetite Eyes: No visual changes, double vision, eye pain Ear, nose and throat: No hearing loss, ear pain, nasal congestion, sore throat Cardiovascular: No chest pain, palpitations Respiratory: No shortness of breath at rest or with exertion.   No wheezes GastrointestinaI: No nausea, vomiting, diarrhea, abdominal pain, fecal incontinence Genitourinary: No dysuria, urinary retention or frequency.  No nocturia. Musculoskeletal: as above Integumentary: No rash, pruritus, skin lesions Neurological: as above Psychiatric: No depression at this time.  No anxiety Endocrine: No palpitations, diaphoresis, change in appetite, change in weigh or increased thirst Hematologic/Lymphatic: No anemia, purpura, petechiae. Allergic/Immunologic: No itchy/runny eyes, nasal congestion, recent allergic reactions, rashes  ALLERGIES: Allergies  Allergen Reactions  . Sulfa Antibiotics     angioedema    HOME  MEDICATIONS:  Current Outpatient Medications:  .  amphetamine-dextroamphetamine (ADDERALL) 10 MG tablet, One po qPM (Patient taking differently: Take 10 mg by mouth as needed. One po qPM), Disp: 90 tablet, Rfl: 0 .  Cyanocobalamin (VITAMIN B-12 PO), Take 1 tablet by mouth daily. , Disp: , Rfl:  .  cycloSPORINE (RESTASIS) 0.05 % ophthalmic emulsion, Place 1 drop into both eyes daily. , Disp: , Rfl:  .  drospirenone-ethinyl estradiol (YASMIN,ZARAH,SYEDA) 3-0.03 MG tablet, Take 1 tablet by mouth daily., Disp: , Rfl:  .  folic acid (FOLVITE) 1 MG tablet, Take 1 mg by mouth daily. 3-4 mg daily, Disp: , Rfl: 0 .  hydroxychloroquine (PLAQUENIL) 200 MG tablet, Take 300 mg by mouth daily. , Disp: , Rfl:  .  Methotrexate Sodium (METHOTREXATE, PF,) 200 MG/8ML injection, Inject subcutaneous 1 cc once a week, Disp: , Rfl:  .  nystatin (MYCOSTATIN) 100000 UNIT/ML suspension, as needed. , Disp: , Rfl: 0 .  ondansetron (ZOFRAN) 4 MG tablet, Take 1 tablet (4 mg total) by mouth every 8 (eight) hours as needed for nausea or vomiting., Disp: 20 tablet, Rfl: 3 .  oxyCODONE-acetaminophen (ROXICET) 5-325 MG tablet, Take 1 tablet by mouth every 4 (four) hours as needed for severe pain., Disp: 60 tablet, Rfl: 0 .  tiZANidine (ZANAFLEX) 4 MG capsule, Take 1 capsule (4 mg total) by mouth 3 (three) times daily as needed for muscle spasms., Disp: 90 capsule, Rfl: 5 .  VYVANSE 70 MG capsule, Take 1 capsule (70 mg total) by mouth daily., Disp: 90 capsule, Rfl: 0  PAST MEDICAL HISTORY: Past Medical History:  Diagnosis Date  . Connective tissue disease ([place])   . Fatigue   . Hypovitaminosis D   . Lupus ([place])   . Lupus ([place])   . 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 Pericarditis   . Thrush     PAST SURGICAL HISTORY: Past Surgical History:  Procedure Laterality Date  . CESAREAN SECTION    . HERNIA REPAIR    . MOUTH SURGERY  03/2016    FAMILY HISTORY: Family History  Problem Relation Age of Onset  . Hypertension  Father   . Lupus Maternal Grandmother   . Lupus Unknown     SOCIAL HISTORY:  Social History   Socioeconomic History  . Marital status: Married  Spouse name: Not on file  . Number of children: Not on file  . Years of education: Not on file  . Highest education level: Not on file  Occupational History  . Not on file  Social Needs  . Financial resource strain: Not on file  . Food insecurity:    Worry: Not on file    Inability: Not on file  . Transportation needs:    Medical: Not on file    Non-medical: Not on file  Tobacco Use  . Smoking status: Never Smoker  . Smokeless tobacco: Never Used  Substance and Sexual Activity  . Alcohol use: No  . Drug use: No  . Sexual activity: Not on file  Lifestyle  . Physical activity:    Days per week: Not on file    Minutes per session: Not on file  . Stress: Not on file  Relationships  . Social connections:    Talks on phone: Not on file    Gets together: Not on file    Attends religious service: Not on file    Active member of club or organization: Not on file    Attends meetings of clubs or organizations: Not on file    Relationship status: Not on file  . Intimate partner violence:    Fear of current or ex partner: Not on file    Emotionally abused: Not on file    Physically abused: Not on file    Forced sexual activity: Not on file  Other Topics Concern  . Not on file  Social History Narrative   Lives at home w/ her family   Right-handed   Caffeine: cup of tea per day     PHYSICAL EXAM  There were no vitals filed for this visit.  There is no height or weight on file to calculate BMI.   General: The patient is well-developed and well-nourished and in no acute distress.   He has some tenderness over the SI joints.  Neurologic Exam  Mental status: The patient is alert and oriented x 3 at the time of the examination. The patient has apparent normal recent and remote memory, with an apparently normal attention  span and concentration ability.   Speech is normal.  Cranial nerves: Extraocular movements are full.  Facial strength is normal.  Motor:  Muscle bulk is normal.   Tone is normal. Strength is  5 / 5 in all 4 extremities.   Sensory: Sensory testing is intact to touch sensation in all 4 extremities.   Gait and station: Station is normal.   The gait and the tandem gait are normal. Reflexes: Deep tendon reflexes are symmetric and normal bilaterally.       DIAGNOSTIC DATA (LABS, IMAGING, TESTING) - I reviewed patient records, labs, notes, testing and imaging myself where available.  Lab Results  Component Value Date   WBC 9.4 07/20/2014   HGB 12.4 07/20/2014   HCT 36.3 07/20/2014   MCV 96.8 07/20/2014   PLT 297 07/20/2014      Component Value Date/Time   NA 136 07/20/2014 2250   K 4.1 07/20/2014 2250   CL 104 07/20/2014 2250   CO2 26 07/20/2014 2250   GLUCOSE 98 07/20/2014 2250   BUN 20 07/20/2014 2250   CREATININE 0.95 07/20/2014 2250   CALCIUM 9.1 07/20/2014 2250   PROT 7.7 07/20/2014 2250   ALBUMIN 3.9 07/20/2014 2250   AST 27 07/20/2014 2250   ALT 18 07/20/2014 2250   ALKPHOS 55 07/20/2014  2250   BILITOT 0.3 07/20/2014 2250   GFRNONAA 73 (L) 07/20/2014 2250   GFRAA 84 (L) 07/20/2014 2250   Lab Results  Component Value Date   CHOL 182 12/09/2010   HDL 83 12/09/2010   LDLCALC  12/09/2010    72        Total Cholesterol/HDL:CHD Risk Coronary Heart Disease Risk Table                     Men   Women  1/2 Average Risk   3.4   3.3  Average Risk       5.0   4.4  2 X Average Risk   9.6   7.1  3 X Average Risk  23.4   11.0        Use the calculated Patient Ratio above and the CHD Risk Table to determine the patient's CHD Risk.        ATP III CLASSIFICATION (LDL):  <100     mg/dL   Optimal  100-129  mg/dL   Near or Above                    Optimal  130-159  mg/dL   Borderline  160-189  mg/dL   High  >190     mg/dL   Very High   TRIG 133 12/09/2010   CHOLHDL 2.2  12/09/2010   Lab Results  Component Value Date   HGBA1C  12/08/2010    5.5 (NOTE)                                                                       According to the ADA Clinical Practice Recommendations for 2011, when HbA1c is used as a screening test:   >=6.5%   Diagnostic of Diabetes Mellitus           (if abnormal result  is confirmed)  5.7-6.4%   Increased risk of developing Diabetes Mellitus  References:Diagnosis and Classification of Diabetes Mellitus,Diabetes Care,2011,34(Suppl 1):S62-S69 and Standards of Medical Care in         Diabetes - 2011,Diabetes Care,2011,34  (Suppl 1):S11-S61.      ASSESSMENT AND PLAN  Systemic lupus erythematosus, unspecified SLE type, unspecified organ involvement status ([place])  Attention deficit disorder (ADD) without hyperactivity  Sacroiliac joint pain  Migraine without aura and without status migrainosus, not intractable   Richard A. Felecia Shelling, MD, PhD 3/41/9379, 02:40 AM Certified in Neurology, Clinical Neurophysiology, Sleep Medicine, Pain Medicine and Neuroimaging  [facility] [address] [place], [place] [address] [phone]

## 2019-01-10 ENCOUNTER — Other Ambulatory Visit: Payer: Self-pay | Admitting: Neurology

## 2019-01-10 MED ORDER — VYVANSE 70 MG PO CAPS: 70.0000 mg | ORAL_CAPSULE | Freq: Every day | ORAL | 0 refills | Status: DC

## 2019-01-10 NOTE — Addendum Note (Signed)
Addended by: Hope Pigeon on: 01/10/2019 02:15 PM   Modules accepted: Orders

## 2019-01-10 NOTE — Telephone Encounter (Signed)
Pt has called for a refill on her VYVANSE 70 MG capsule [facility]

## 2019-04-25 ENCOUNTER — Other Ambulatory Visit: Payer: Self-pay

## 2019-04-25 ENCOUNTER — Encounter: Payer: Self-pay | Admitting: Neurology

## 2019-04-25 ENCOUNTER — Telehealth: Payer: Self-pay | Admitting: Neurology

## 2019-04-25 ENCOUNTER — Ambulatory Visit: Payer: PRIVATE HEALTH INSURANCE | Admitting: Neurology

## 2019-04-25 VITALS — BP 148/85 | HR 85 | Temp 97.8°F | Ht 65.0 in | Wt 147.0 lb

## 2019-04-25 DIAGNOSIS — M329 Systemic lupus erythematosus, unspecified: Secondary | ICD-10-CM

## 2019-04-25 DIAGNOSIS — M533 Sacrococcygeal disorders, not elsewhere classified: Secondary | ICD-10-CM

## 2019-04-25 DIAGNOSIS — F988 Other specified behavioral and emotional disorders with onset usually occurring in childhood and adolescence: Secondary | ICD-10-CM | POA: Diagnosis not present

## 2019-04-25 DIAGNOSIS — G43009 Migraine without aura, not intractable, without status migrainosus: Secondary | ICD-10-CM

## 2019-04-25 MED ORDER — VYVANSE 70 MG PO CAPS: 70.0000 mg | ORAL_CAPSULE | Freq: Every day | ORAL | 0 refills | Status: DC

## 2019-04-25 MED ORDER — OXYCODONE-ACETAMINOPHEN 5-325 MG PO TABS: 1.0000 | ORAL_TABLET | ORAL | 0 refills | Status: AC | PRN

## 2019-04-25 MED ORDER — AMPHETAMINE-DEXTROAMPHETAMINE 10 MG PO TABS: ORAL_TABLET | ORAL | 0 refills | Status: DC

## 2019-04-25 MED ORDER — OXYCODONE-ACETAMINOPHEN 5-325 MG PO TABS: 1.0000 | ORAL_TABLET | ORAL | 0 refills | Status: DC | PRN

## 2019-04-25 NOTE — Progress Notes (Signed)
[facility]  PATIENT: [name] DOB: 1971/03/03  REFERRING DOCTOR OR PCP:  Odella Aquas SOURCE: patient and records form [facility]  _________________________________   HISTORICAL  CHIEF COMPLAINT:  Chief Complaint  Patient presents with  . Follow-up    RM 13, alone. Last seen 10/25/2018.   . 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    Update 04/25/19: Her ADHD is doing well on Vyvanse 70 mg daily.  She also takes Adderall additional 10 mg now and then if she needs to stay focused for longer..  Rheumatologic issues are doing well.  Her SI joint has not been bothering her as much.  She is on methotrexate and Plaquenil.  She has not needed much oxycodone over the past year.  She is noting more headaches in general but not bad enough for a trigger point injection..   She had 3 days in a row of a severe headache 2-3 weeks ago.   Most of the time headaches are less common.    We discussed Ubrelzy or Nurtec for migraines (can't do triptans with cardiology issues)  Update 10/25/2018 (virtual): She feels that her attention deficit disorder is generally doing well.  She still has some afternoons where she feels focus begins to wane.  She occasionally takes Adderall 10 mg additionally in the afternoon with some benefit.  Some days she will just take 5 mg.  She takes Vyvanse 70 mg every morning.  We discussed the new medication, Mydayis, but for now she will stay on the Vyvanse.  Migraines are doing well and she has not had any significant ones recently.    Her lupus has been well controlled on Plaquenil and methotrexate.  Sacroiliac pain has not been much of a problem.  However, she did hurt her foot playing tennis and she is having a lot of foot pain now.  She is scheduled to see podiatry soon.  Tizanidine has helped pain when it  occurs.     Update 04/26/2018: The stimulants have helped her ADD a lot.   Vyvanse usually wears off by mid afternoon so she adds immediate release Adderall.     She tolerates them well.  No difficulty with appetite or sleep.  Her SLE is stable but some changes in med's are being considered.   She stays active and exercises regularly.    She has a lot of achiness in some joints and muscles.    She rarely has to take a Percocet  Her migraines are doing well.   She rarely has one.    She will take a Percocet if the headache is more severe but seldom more than a couple months        Update 10/19/2017: She is doing well in general.  The ADD is well controlled most days with Vyvanse 70 mg plus Adderall 10 mg in the afternoon.     She still notes some decreased processing at times.  Migraines are doing well.   Sometimes she gets an unusual sensation in her forehead associated with dissociative feelings.   Her LBP and SI/piroiformis pain improved after the TPI.    Her lupus is doing ok but joint pain has been worse and a biologic is being considered.   She is thinking about this but concerned about the potential risks including PML.  Insomnia has done  well most nights.  Update 04/13/2017:      She is generally feeling well.    Migraines are well controlled.  Her neck pain is also doing well.  Poor sleep may trigger one.     Vyvanse is helping her ADD a lot.   However, a lot of days, she feels focus starts to waver in the late afternoon.    Tizanidine prn is helping her sleep.   She takes 4 to 6 mg as needed.    She is active and continues to exercise.    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      She is sleeping well at night.  She has lupus and is currently on Flexeril, methotrexate and folic acid. Biologic agents have been discussed with her. She continues to have some SI joint pain but it is doing better with exercise. She has not needed to use many of her Percocets.   ______________________________ From 10/12/2016:   Migraines: Migraines are generally doing well.   She opted to not switch from Topamax to zonisamide.    She has not been taking Trokendi as much.  Sleep deprivation can trigger one.     Some migraines are associated with cognitive fog  With word finding errors.     Often with the migraine, she will have neck pain associated with migraines.   Pain is mostly right sided.   She denies visual aura but if she gets a spaced out sensation as an aura, her migraines are usually worse.  Her dose of Trokendi is only 25 mg but that helps a lot.  MRI of the brain was fine in the past  Musculoskeletal Pain/SLE:   Her worse pain is in the right buttock and SI region.  With mildly different pain, a TPI in the piriformis had helped.   She has systemic lupus erythematosus and is seen by Dr. Myrtie Neither for her lupus she is on Plaquenil and methotrexate with benefit.  The onset of SLE followed a parvovirus infection in 2012.   At that time, her ANA was positive with the subsets showing the presence of anti-DNA antibodies. ESR was also mildly elevated.  SI joint improved after PT.    Percocet helps the pain and she takes it sparingly.  She tries to exercise regularly and runs sometimes.     ADD: She has attention deficit disorder x many years.    On a test in 2011, she had poor performance in complex attentional and cognitive flexibility tasks. She has been on Vyvanse and tolerates it well. It has helped her to stay focused and to perform better at work.   REVIEW OF SYSTEMS: Constitutional: No fevers, chills, sweats, or change in appetite Eyes: No visual changes, double vision, eye pain Ear, nose and throat: No hearing loss, ear pain, nasal congestion, sore throat Cardiovascular: No chest pain, palpitations Respiratory: No shortness of breath at rest or with exertion.   No wheezes GastrointestinaI: No nausea, vomiting, diarrhea, abdominal pain, fecal incontinence  Genitourinary: No dysuria, urinary retention or frequency.  No nocturia. Musculoskeletal: as above Integumentary: No rash, pruritus, skin lesions Neurological: as above Psychiatric: No depression at this time.  No anxiety Endocrine: No palpitations, diaphoresis, change in appetite, change in weigh or increased thirst Hematologic/Lymphatic: No anemia, purpura, petechiae. Allergic/Immunologic: No itchy/runny eyes, nasal congestion, recent allergic reactions, rashes  ALLERGIES: Allergies  Allergen Reactions  . Sulfa Antibiotics     angioedema    HOME MEDICATIONS:  Current  Outpatient Medications:  .  amphetamine-dextroamphetamine (ADDERALL) 10 MG tablet, One po qPM, Disp: 90 tablet, Rfl: 0 .  Cyanocobalamin (VITAMIN B-12 PO), Take 1 tablet by mouth daily. , Disp: , Rfl:  .  cycloSPORINE (RESTASIS) 0.05 % ophthalmic emulsion, Place 1 drop into both eyes daily. , Disp: , Rfl:  .  folic acid (FOLVITE) 1 MG tablet, Take 1 mg by mouth daily. 3-4 mg daily, Disp: , Rfl: 0 .  hydroxychloroquine (PLAQUENIL) 200 MG tablet, Take 300 mg by mouth daily. , Disp: , Rfl:  .  Methotrexate Sodium (METHOTREXATE, PF,) 200 MG/8ML injection, Inject subcutaneous 1 cc once a week, Disp: , Rfl:  .  nystatin (MYCOSTATIN) 100000 UNIT/ML suspension, as needed. , Disp: , Rfl: 0 .  ondansetron (ZOFRAN) 4 MG tablet, Take 1 tablet (4 mg total) by mouth every 8 (eight) hours as needed for nausea or vomiting., Disp: 20 tablet, Rfl: 3 .  oxyCODONE-acetaminophen (ROXICET) 5-325 MG tablet, Take 1 tablet by mouth every 4 (four) hours as needed for severe pain., Disp: 60 tablet, Rfl: 0 .  tiZANidine (ZANAFLEX) 4 MG capsule, Take 1 capsule (4 mg total) by mouth 3 (three) times daily as needed for muscle spasms., Disp: 90 capsule, Rfl: 5 .  VYVANSE 70 MG capsule, Take 1 capsule (70 mg total) by mouth daily., Disp: 90 capsule, Rfl: 0 .  drospirenone-ethinyl estradiol (YASMIN,ZARAH,SYEDA) 3-0.03 MG tablet, Take 1 tablet by mouth  daily., Disp: , Rfl:   PAST MEDICAL HISTORY: Past Medical History:  Diagnosis Date  . Connective tissue disease ([place])   . Fatigue   . Hypovitaminosis D   . Lupus ([place])   . Lupus ([place])   . 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 Pericarditis   . Thrush     PAST SURGICAL HISTORY: Past Surgical History:  Procedure Laterality Date  . CESAREAN SECTION    . HERNIA REPAIR    . MOUTH SURGERY  03/2016    FAMILY HISTORY: Family History  Problem Relation Age of Onset  . Hypertension Father   . Lupus Maternal Grandmother   . Lupus Unknown     SOCIAL HISTORY:  Social History   Socioeconomic History  . Marital status: Married    Spouse name: Not on file  . Number of children: Not on file  . Years of education: Not on file  . Highest education level: Not on file  Occupational History  . Not on file  Social Needs  . Financial resource strain: Not on file  . Food insecurity    Worry: Not on file    Inability: Not on file  . Transportation needs    Medical: Not on file    Non-medical: Not on file  Tobacco Use  . Smoking status: Never Smoker  . Smokeless tobacco: Never Used  Substance and Sexual Activity  . Alcohol use: No  . Drug use: No  . Sexual activity: Not on file  Lifestyle  . Physical activity    Days per week: Not on file    Minutes per session: Not on file  . Stress: Not on file  Relationships  . Social Herbalist on phone: Not on file    Gets together: Not on file    Attends religious service: Not on file    Active member of club or organization: Not on file    Attends meetings of clubs or organizations: Not on file    Relationship status: Not on file  .  Intimate partner violence    Fear of current or ex partner: Not on file    Emotionally abused: Not on file    Physically abused: Not on file    Forced sexual activity: Not on file  Other Topics Concern  . Not on file  Social History Narrative   Lives at home w/ her family   Right-handed    Caffeine: cup of tea per day     PHYSICAL EXAM  Vitals:   04/25/19 1100  BP: (!) 148/85  Pulse: 85  Temp: 97.8 F (36.6 C)  Weight: 147 lb (66.7 kg)  Height: 5' 5" (1.651 m)    Body mass index is 24.46 kg/m.   General: The patient is well-developed and well-nourished and in no acute distress.   He has some tenderness over the SI joints.  Neurologic Exam  Mental status: The patient is alert and oriented x 3 at the time of the examination. The patient has apparent normal recent and remote memory, with an apparently normal attention span and concentration ability.   Speech is normal.  Cranial nerves: Extraocular movements are full.  Facial strength is normal.  Motor:  Muscle bulk is normal.   Tone is normal. Strength is  5 / 5 in all 4 extremities.   Sensory: Sensory testing is intact to touch sensation in all 4 extremities.   Gait and station: Station is normal.   The gait and the tandem gait are normal. Reflexes: Deep tendon reflexes are symmetric and normal bilaterally.       DIAGNOSTIC DATA (LABS, IMAGING, TESTING) - I reviewed patient records, labs, notes, testing and imaging myself where available.  Lab Results  Component Value Date   WBC 9.4 07/20/2014   HGB 12.4 07/20/2014   HCT 36.3 07/20/2014   MCV 96.8 07/20/2014   PLT 297 07/20/2014      Component Value Date/Time   NA 136 07/20/2014 2250   K 4.1 07/20/2014 2250   CL 104 07/20/2014 2250   CO2 26 07/20/2014 2250   GLUCOSE 98 07/20/2014 2250   BUN 20 07/20/2014 2250   CREATININE 0.95 07/20/2014 2250   CALCIUM 9.1 07/20/2014 2250   PROT 7.7 07/20/2014 2250   ALBUMIN 3.9 07/20/2014 2250   AST 27 07/20/2014 2250   ALT 18 07/20/2014 2250   ALKPHOS 55 07/20/2014 2250   BILITOT 0.3 07/20/2014 2250   GFRNONAA 73 (L) 07/20/2014 2250   GFRAA 84 (L) 07/20/2014 2250   Lab Results  Component Value Date   CHOL 182 12/09/2010   HDL 83 12/09/2010   LDLCALC  12/09/2010    72        Total  Cholesterol/HDL:CHD Risk Coronary Heart Disease Risk Table                     Men   Women  1/2 Average Risk   3.4   3.3  Average Risk       5.0   4.4  2 X Average Risk   9.6   7.1  3 X Average Risk  23.4   11.0        Use the calculated Patient Ratio above and the CHD Risk Table to determine the patient's CHD Risk.        ATP III CLASSIFICATION (LDL):  <100     mg/dL   Optimal  100-129  mg/dL   Near or Above  Optimal  130-159  mg/dL   Borderline  160-189  mg/dL   High  >190     mg/dL   Very High   TRIG 133 12/09/2010   CHOLHDL 2.2 12/09/2010   Lab Results  Component Value Date   HGBA1C  12/08/2010    5.5 (NOTE)                                                                       According to the ADA Clinical Practice Recommendations for 2011, when HbA1c is used as a screening test:   >=6.5%   Diagnostic of Diabetes Mellitus           (if abnormal result  is confirmed)  5.7-6.4%   Increased risk of developing Diabetes Mellitus  References:Diagnosis and Classification of Diabetes Mellitus,Diabetes Care,2011,34(Suppl 1):S62-S69 and Standards of Medical Care in         Diabetes - 2011,Diabetes Care,2011,34  (Suppl 1):S11-S61.      ASSESSMENT AND PLAN  Systemic lupus erythematosus, unspecified SLE type, unspecified organ involvement status (HCC)  Sacroiliac joint pain  Attention deficit disorder (ADD) without hyperactivity  Migraine without aura and without status migrainosus, not intractable  1.   For the attention deficit disorder, she will continue Vyvanse 70 mg in the morning and an additional 5 to 10 mg as needed in the afternoon.  If she has severe pain, she can take a oxycodone. 2.   Muscle spasms are better so she has not treated much tizanidine. 3.   I gave her some samples of Ubrelzy for migraine breakthrough.  We will send in a prescription if this is helpful. 4.   She will return to see me in 6 months or sooner if there are new or worsening  neurologic symptoms.  Terrill Alperin A. Felecia Shelling, MD, PhD 21/05/7355, 7:01 PM Certified in Neurology, Clinical Neurophysiology, Sleep Medicine, Pain Medicine and Neuroimaging  [facility] [address] [place], [place] [address] [phone]

## 2019-04-25 NOTE — Telephone Encounter (Signed)
[facility] called needing to speak to the RN about the pt's oxyCODONE-acetaminophen (ROXICET) 5-325 MG tablet  Can speak to pharmacist on duty.

## 2019-04-25 NOTE — Telephone Encounter (Signed)
Called pharmacy back. They wanted to make sure ok to fill 7 days supply only. That is what insurance is allowing since this is new rx and considered acute treatment. I placed on hold and spoke with Dr. Felecia Shelling. He approved for 7 days supply. If she needs refill in the future he is ok to refill. I relayed this to pharmacy. They verbalized understanding and will let pt know. Pt takes for dx:  She has sacroiliitis associate with systemic lupus

## 2019-04-27 ENCOUNTER — Other Ambulatory Visit: Payer: Self-pay | Admitting: Neurology

## 2019-04-27 MED ORDER — UBRELVY 50 MG PO TABS: 50.0000 mg | ORAL_TABLET | Freq: Every day | ORAL | 5 refills | Status: DC | PRN

## 2019-04-27 NOTE — Progress Notes (Signed)
The sample of Ubrelvy helped her.  I will call in a prescription.

## 2019-08-17 ENCOUNTER — Other Ambulatory Visit: Payer: Self-pay | Admitting: *Deleted

## 2019-08-18 MED ORDER — VYVANSE 70 MG PO CAPS: 70.0000 mg | ORAL_CAPSULE | Freq: Every day | ORAL | 0 refills | Status: DC

## 2019-10-24 ENCOUNTER — Ambulatory Visit: Payer: PRIVATE HEALTH INSURANCE | Admitting: Neurology

## 2019-10-31 ENCOUNTER — Other Ambulatory Visit: Payer: Self-pay

## 2019-10-31 ENCOUNTER — Encounter: Payer: Self-pay | Admitting: Neurology

## 2019-10-31 ENCOUNTER — Ambulatory Visit: Payer: PRIVATE HEALTH INSURANCE | Admitting: Neurology

## 2019-10-31 VITALS — BP 144/90 | HR 76 | Temp 97.2°F | Ht 65.0 in | Wt 150.0 lb

## 2019-10-31 DIAGNOSIS — M533 Sacrococcygeal disorders, not elsewhere classified: Secondary | ICD-10-CM

## 2019-10-31 DIAGNOSIS — G43009 Migraine without aura, not intractable, without status migrainosus: Secondary | ICD-10-CM | POA: Diagnosis not present

## 2019-10-31 DIAGNOSIS — F988 Other specified behavioral and emotional disorders with onset usually occurring in childhood and adolescence: Secondary | ICD-10-CM

## 2019-10-31 MED ORDER — VYVANSE 70 MG PO CAPS: 70.0000 mg | ORAL_CAPSULE | Freq: Every day | ORAL | 0 refills | Status: DC

## 2019-10-31 MED ORDER — UBRELVY 50 MG PO TABS: 50.0000 mg | ORAL_TABLET | Freq: Every day | ORAL | 5 refills | Status: DC | PRN

## 2019-10-31 MED ORDER — AMPHETAMINE-DEXTROAMPHETAMINE 10 MG PO TABS: ORAL_TABLET | ORAL | 0 refills | Status: DC

## 2019-10-31 NOTE — Progress Notes (Signed)
[facility]  PATIENT: [name] DOB: 11-29-1970  REFERRING DOCTOR OR PCP:  Odella Aquas SOURCE: patient and records form [facility]  _________________________________   HISTORICAL  CHIEF COMPLAINT:  Chief Complaint  Patient presents with  . Follow-up    RM 12, alone. Last seen 04/25/2019. Here for regular f/u. 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    Update 10/31/2019 She feels she has been stable.    Her ADHD is doing well on Vyvanse 70 mg + 10-20 mg Adderall as needed.  She still feels some afternoons, focus and attention do worse.    She has had some more anxiety than before.  She is not sure its severe enough to be treated.  She is sleeping ok most nights but sometimes gets only 5-6 hours.    Migraines are doing better.    [name] has stopped most migraines quickly.    Rheumatologic issues are generally better.   She has less SI joint pain than in the past.   She plays tennis frequently.    She had Covid-19 but only had mild symptoms (loss of smell.taste).        Update 04/25/19: Her ADHD is doing well on Vyvanse 70 mg daily.  She also takes Adderall additional 10 mg now and then if she needs to stay focused for longer..  Rheumatologic issues are doing well.  Her SI joint has not been bothering her as much.  She is on methotrexate and Plaquenil.  She has not needed much oxycodone over the past year.  She is noting more headaches in general but not bad enough for a trigger point injection..   She had 3 days in a row of a severe headache 2-3 weeks ago.   Most of the time headaches are less common.    We discussed Ubrelzy or Nurtec for migraines (can't do triptans with cardiology issues)  Update 10/25/2018 (virtual): She feels that her attention deficit disorder is  generally doing well.  She still has some afternoons where she feels focus begins to wane.  She occasionally takes Adderall 10 mg additionally in the afternoon with some benefit.  Some days she will just take 5 mg.  She takes Vyvanse 70 mg every morning.  We discussed the new medication, Mydayis, but for now she will stay on the Vyvanse.  Migraines are doing well and she has not had any significant ones recently.    Her lupus has been well controlled on Plaquenil and methotrexate.  Sacroiliac pain has not been much of a problem.  However, she did hurt her foot playing tennis and she is having a lot of foot pain now.  She is scheduled to see podiatry soon.  Tizanidine has helped pain when it occurs.     Update 04/26/2018: The stimulants have helped her ADD a lot.   Vyvanse usually wears off by mid afternoon so she adds immediate release Adderall.     She tolerates them well.  No difficulty with appetite or sleep.  Her SLE is stable but some changes in med's are being considered.   She stays active and exercises regularly.    She has a lot of achiness in some joints and muscles.    She rarely has to take a Percocet  Her migraines are  doing well.   She rarely has one.    She will take a Percocet if the headache is more severe but seldom more than a couple months        Update 10/19/2017: She is doing well in general.  The ADD is well controlled most days with Vyvanse 70 mg plus Adderall 10 mg in the afternoon.     She still notes some decreased processing at times.  Migraines are doing well.   Sometimes she gets an unusual sensation in her forehead associated with dissociative feelings.   Her LBP and SI/piroiformis pain improved after the TPI.    Her lupus is doing ok but joint pain has been worse and a biologic is being considered.   She is thinking about this but concerned about the potential risks including PML.  Insomnia has done well most nights.  Update 04/13/2017:      She is generally feeling  well.    Migraines are well controlled.  Her neck pain is also doing well.  Poor sleep may trigger one.     Vyvanse is helping her ADD a lot.   However, a lot of days, she feels focus starts to waver in the late afternoon.    Tizanidine prn is helping her sleep.   She takes 4 to 6 mg as needed.    She is active and continues to exercise.    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      She is sleeping well at night.  She has lupus and is currently on Flexeril, methotrexate and folic acid. Biologic agents have been discussed with her. She continues to have some SI joint pain but it is doing better with exercise. She has not needed to use many of her Percocets.  ______________________________ From 10/12/2016:   Migraines: Migraines are generally doing well.   She opted to not switch from Topamax to zonisamide.    She has not been taking Trokendi as much.  Sleep deprivation can trigger one.     Some migraines are associated with cognitive fog  With word finding errors.     Often with the migraine, she will have neck pain associated with migraines.   Pain is mostly right sided.   She denies visual aura but if she gets a spaced out sensation as an aura, her migraines are usually worse.  Her dose of Trokendi is only 25 mg but that helps a lot.  MRI of the brain was fine in the past  Musculoskeletal Pain/SLE:   Her worse pain is in the right buttock and SI region.  With mildly different pain, a TPI in the piriformis had helped.   She has systemic lupus erythematosus and is seen by Dr. Myrtie Neither for her lupus she is on Plaquenil and methotrexate with benefit.  The onset of SLE followed a parvovirus infection in 2012.   At that time, her ANA was positive with the subsets showing the presence of anti-DNA antibodies. ESR was also mildly elevated.  SI joint improved after PT.    Percocet helps the pain and she takes it sparingly.  She tries to exercise regularly and runs sometimes.     ADD: She has attention deficit  disorder x many years.    On a test in 2011, she had poor performance in complex attentional and cognitive flexibility tasks. She has been on Vyvanse and tolerates it well. It has helped her to stay focused and to perform better at  work.   REVIEW OF SYSTEMS: Constitutional: No fevers, chills, sweats, or change in appetite Eyes: No visual changes, double vision, eye pain Ear, nose and throat: No hearing loss, ear pain, nasal congestion, sore throat Cardiovascular: No chest pain, palpitations Respiratory: No shortness of breath at rest or with exertion.   No wheezes GastrointestinaI: No nausea, vomiting, diarrhea, abdominal pain, fecal incontinence Genitourinary: No dysuria, urinary retention or frequency.  No nocturia. Musculoskeletal: as above Integumentary: No rash, pruritus, skin lesions Neurological: as above Psychiatric: No depression at this time.  No anxiety Endocrine: No palpitations, diaphoresis, change in appetite, change in weigh or increased thirst Hematologic/Lymphatic: No anemia, purpura, petechiae. Allergic/Immunologic: No itchy/runny eyes, nasal congestion, recent allergic reactions, rashes  ALLERGIES: Allergies  Allergen Reactions  . Sulfa Antibiotics     angioedema    HOME MEDICATIONS:  Current Outpatient Medications:  .  amphetamine-dextroamphetamine (ADDERALL) 10 MG tablet, One po qPM, Disp: 90 tablet, Rfl: 0 .  Cyanocobalamin (VITAMIN B-12 PO), Take 1 tablet by mouth daily. , Disp: , Rfl:  .  cycloSPORINE (RESTASIS) 0.05 % ophthalmic emulsion, Place 1 drop into both eyes daily. , Disp: , Rfl:  .  Ergocalciferol (VITAMIN D2 PO), Take 50,000 Units by mouth once a week., Disp: , Rfl:  .  Ferrous Sulfate (IRON PO), Take 325 mg by mouth in the morning and at bedtime., Disp: , Rfl:  .  folic acid (FOLVITE) 1 MG tablet, Take 1 mg by mouth daily. 3-4 mg daily, Disp: , Rfl: 0 .  hydroxychloroquine (PLAQUENIL) 200 MG tablet, Take 300 mg by mouth daily. , Disp: , Rfl:    .  Methotrexate Sodium (METHOTREXATE, PF,) 200 MG/8ML injection, Inject subcutaneous 1 cc once a week, Disp: , Rfl:  .  nystatin (MYCOSTATIN) 100000 UNIT/ML suspension, as needed. , Disp: , Rfl: 0 .  ondansetron (ZOFRAN) 4 MG tablet, Take 1 tablet (4 mg total) by mouth every 8 (eight) hours as needed for nausea or vomiting., Disp: 20 tablet, Rfl: 3 .  oxyCODONE-acetaminophen (ROXICET) 5-325 MG tablet, Take 1 tablet by mouth every 4 (four) hours as needed for severe pain., Disp: 60 tablet, Rfl: 0 .  tiZANidine (ZANAFLEX) 4 MG capsule, Take 1 capsule (4 mg total) by mouth 3 (three) times daily as needed for muscle spasms., Disp: 90 capsule, Rfl: 5 .  Ubrogepant (UBRELVY) 50 MG TABS, Take 50 mg by mouth daily as needed., Disp: 10 tablet, Rfl: 5 .  VYVANSE 70 MG capsule, Take 1 capsule (70 mg total) by mouth daily., Disp: 90 capsule, Rfl: 0 .  drospirenone-ethinyl estradiol (YASMIN,ZARAH,SYEDA) 3-0.03 MG tablet, Take 1 tablet by mouth daily., Disp: , Rfl:   PAST MEDICAL HISTORY: Past Medical History:  Diagnosis Date  . Connective tissue disease ([place])   . Fatigue   . Hypovitaminosis D   . Lupus ([place])   . Lupus ([place])   . 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 Pericarditis   . Thrush     PAST SURGICAL HISTORY: Past Surgical History:  Procedure Laterality Date  . CESAREAN SECTION    . HERNIA REPAIR    . MOUTH SURGERY  03/2016    FAMILY HISTORY: Family History  Problem Relation Age of Onset  . Hypertension Father   . Lupus Maternal Grandmother   . Lupus Unknown     SOCIAL HISTORY:  Social History   Socioeconomic History  . Marital status: Married    Spouse name: Not on file  . Number of children: Not on  file  . Years of education: Not on file  . Highest education level: Not on file  Occupational History  . Not on file  Tobacco Use  . Smoking status: Never Smoker  . Smokeless tobacco: Never Used  Substance and Sexual Activity  . Alcohol use: No  . Drug use: No  . Sexual  activity: Not on file  Other Topics Concern  . Not on file  Social History Narrative   Lives at home w/ her family   Right-handed   Caffeine: cup of tea per day   Social Determinants of Health   Financial Resource Strain:   . Difficulty of Paying Living Expenses:   Food Insecurity:   . Worried About Charity fundraiser in the Last Year:   . Arboriculturist in the Last Year:   Transportation Needs:   . Film/video editor (Medical):   Marland Kitchen Lack of Transportation (Non-Medical):   Physical Activity:   . Days of Exercise per Week:   . Minutes of Exercise per Session:   Stress:   . Feeling of Stress :   Social Connections:   . Frequency of Communication with Friends and Family:   . Frequency of Social Gatherings with Friends and Family:   . Attends Religious Services:   . Active Member of Clubs or Organizations:   . Attends Archivist Meetings:   Marland Kitchen Marital Status:   Intimate Partner Violence:   . Fear of Current or Ex-Partner:   . Emotionally Abused:   Marland Kitchen Physically Abused:   . Sexually Abused:      PHYSICAL EXAM  Vitals:   10/31/19 1258  BP: (!) 144/90  Pulse: 76  Temp: (!) 97.2 F (36.2 C)  Weight: 150 lb (68 kg)  Height: 5' 5"  (1.651 m)   repeat BP 135/85  Body mass index is 24.96 kg/m.   General: The patient is well-developed and well-nourished and in no acute distress.     Neurologic Exam  Mental status: The patient is alert and oriented x 3 at the time of the examination. The patient has apparent normal recent and remote memory, with an apparently normal attention span and concentration ability.   Speech is normal.  Cranial nerves: Extraocular movements are full.  Facial strength is normal.  Motor:  Muscle bulk is normal.   Tone is normal. Strength is  5 / 5 in all 4 extremities.   Sensory: Sensory testing is intact to touch sensation in all 4 extremities.   Gait and station: Station is normal.   The gait and the tandem gait are  normal.  Reflexes: Deep tendon reflexes are symmetric and normal bilaterally.        ASSESSMENT AND PLAN  Migraine without aura and without status migrainosus, not intractable  Attention deficit disorder (ADD) without hyperactivity  Sacroiliac joint pain  1.   Continue Vyvanse 70 mg in the morning and an additional 5 to 10 mg as needed in the afternoon.  2.   Muscle spasms are better so she has not treated much tizanidine. 2.   SI joint pain is better.   If she has severe pain, she can take a oxycodone. 3.   Continue Ubrelzy for migraine breakthrough.   4.   She will return to see me in 6 months or sooner if there are new or worsening neurologic symptoms.  25-minute office visit with the majority of the time spent face-to-face for history and physical, discussion/counseling and decision-making.  Additional time with record review and documentation.  [name] A. Felecia Shelling, MD, PhD 6/78/9381, 0:17 PM Certified in Neurology, Clinical Neurophysiology, Sleep Medicine, Pain Medicine and Neuroimaging  [facility] [address] [place], [place] [address] [phone]

## 2020-03-19 ENCOUNTER — Other Ambulatory Visit: Payer: Self-pay | Admitting: *Deleted

## 2020-03-19 MED ORDER — AMPHETAMINE-DEXTROAMPHETAMINE 10 MG PO TABS: ORAL_TABLET | ORAL | 0 refills | Status: DC

## 2020-03-19 MED ORDER — VYVANSE 70 MG PO CAPS: 70.0000 mg | ORAL_CAPSULE | Freq: Every day | ORAL | 0 refills | Status: DC

## 2020-05-07 ENCOUNTER — Other Ambulatory Visit: Payer: Self-pay

## 2020-05-07 ENCOUNTER — Ambulatory Visit: Payer: PRIVATE HEALTH INSURANCE | Admitting: Neurology

## 2020-05-07 VITALS — BP 151/89 | HR 82 | Ht 65.0 in | Wt 154.0 lb

## 2020-05-07 DIAGNOSIS — G43009 Migraine without aura, not intractable, without status migrainosus: Secondary | ICD-10-CM

## 2020-05-07 DIAGNOSIS — M533 Sacrococcygeal disorders, not elsewhere classified: Secondary | ICD-10-CM

## 2020-05-07 DIAGNOSIS — F418 Other specified anxiety disorders: Secondary | ICD-10-CM

## 2020-05-07 DIAGNOSIS — M329 Systemic lupus erythematosus, unspecified: Secondary | ICD-10-CM | POA: Diagnosis not present

## 2020-05-07 DIAGNOSIS — F988 Other specified behavioral and emotional disorders with onset usually occurring in childhood and adolescence: Secondary | ICD-10-CM

## 2020-05-07 MED ORDER — SERTRALINE HCL 50 MG PO TABS: 50.0000 mg | ORAL_TABLET | Freq: Every day | ORAL | 3 refills | Status: DC

## 2020-05-07 MED ORDER — TIZANIDINE HCL 4 MG PO CAPS: 4.0000 mg | ORAL_CAPSULE | Freq: Three times a day (TID) | ORAL | 5 refills | Status: AC | PRN

## 2020-05-07 NOTE — Progress Notes (Signed)
[facility]  PATIENT: [name] DOB: 07/12/1970  REFERRING DOCTOR OR PCP:  [name]     SOURCE: patient and records form [facility]  _________________________________   HISTORICAL  CHIEF COMPLAINT:  Chief Complaint  Patient presents with  . Follow-up    RM 12, alone. Last seen 10/31/19. Wanting to talk about adding wellbutrin.  . Migraine    Takes ubrelvy prn  . ADD    Takes vyvanse  . 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    Update 05/07/2020 She feels mostly stable.    Her ADHD is doing well on Vyvanse 70 mg + 10-20 mg Adderall as needed.  She still feels some afternoons, focus and attention do worse.   She has had some depression.  In the past, she had been on Wellbutrin and wishes to consider.  She has also tried Paxil, Lexapro and Effexor and she felt affect became too flat.    She has some anxiety, mostly at work when overloaded.   She sleeps ok most nights and sometimes will use tizanidine.      Migraines are doing better.    [name] has stopped most migraines quickly.   She takes oxycodone if HA is worse.  Rheumatologic issues are generally better.  She has less SI joint pain than in the past.    She plays tennis frequently but had a recent toe dislocation.    Vit D and B12 were low and   She had Covid-19 but only had mild symptoms - still has loss of smell and taste.       SI joint pain/SLE:    She has systemic lupus erythematosus and is seen by Dr. Myrtie Banks for her lupus she is on Plaquenil and methotrexate with benefit.  The onset of SLE followed a parvovirus infection in 2012.   At that time, her ANA was positive with the subsets showing the presence of anti-DNA antibodies. ESR was also mildly elevated.  SI joint improved after PT.   in the past, she had several fluoroscopically guided SI joint  injections with benefit.  Percocet helps the pain and she takes it sparingly.  She tries to exercise regularly and runs sometimes.     ADD: She has attention deficit disorder x many years.    On a test in 2011, she had poor performance in complex attentional and cognitive flexibility tasks.  Medications have helped her to stay focused and to perform better at work.    REVIEW OF SYSTEMS: Constitutional: No fevers, chills, sweats, or change in appetite Eyes: No visual changes, double vision, eye pain Ear, nose and throat: No hearing loss, ear pain, nasal congestion, sore throat Cardiovascular: No chest pain, palpitations Respiratory: No shortness of breath at rest or with exertion.   No wheezes GastrointestinaI: No nausea, vomiting, diarrhea, abdominal pain, fecal incontinence Genitourinary: No dysuria, urinary retention or frequency.  No nocturia. Musculoskeletal: as above Integumentary: No rash, pruritus, skin lesions Neurological: as above Psychiatric: No depression at this time.  No anxiety Endocrine: No palpitations, diaphoresis, change in appetite, change in weigh or increased thirst Hematologic/Lymphatic: No anemia, purpura, petechiae. Allergic/Immunologic: No itchy/runny eyes, nasal congestion, recent allergic reactions, rashes  ALLERGIES: Allergies  Allergen Reactions  . Sulfa Antibiotics     angioedema    HOME MEDICATIONS:  Current Outpatient Medications:  .  amphetamine-dextroamphetamine (ADDERALL) 10 MG tablet, One po qPM, Disp: 90 tablet, Rfl: 0 .  Cyanocobalamin (VITAMIN B-12 PO), Take 1 tablet by mouth daily. , Disp: , Rfl:  .  cycloSPORINE (RESTASIS) 0.05 % ophthalmic emulsion, Place 1 drop into both eyes daily. , Disp: , Rfl:  .  Ergocalciferol (VITAMIN D2 PO), Take 50,000 Units by mouth once a week., Disp: , Rfl:  .  Ferrous Sulfate (IRON PO), Take 325 mg by mouth in the morning and at bedtime., Disp: , Rfl:  .  folic acid (FOLVITE) 1 MG tablet, Take 1 mg by  mouth daily. 3-4 mg daily, Disp: , Rfl: 0 .  hydroxychloroquine (PLAQUENIL) 200 MG tablet, Take 300 mg by mouth daily. , Disp: , Rfl:  .  Methotrexate Sodium (METHOTREXATE, PF,) 200 MG/8ML injection, Inject subcutaneous 1 cc once a week, Disp: , Rfl:  .  nystatin (MYCOSTATIN) 100000 UNIT/ML suspension, as needed. , Disp: , Rfl: 0 .  ondansetron (ZOFRAN) 4 MG tablet, Take 1 tablet (4 mg total) by mouth every 8 (eight) hours as needed for nausea or vomiting., Disp: 20 tablet, Rfl: 3 .  oxyCODONE-acetaminophen (ROXICET) 5-325 MG tablet, Take 1 tablet by mouth every 4 (four) hours as needed for severe pain., Disp: 60 tablet, Rfl: 0 .  tiZANidine (ZANAFLEX) 4 MG capsule, Take 1 capsule (4 mg total) by mouth 3 (three) times daily as needed for muscle spasms., Disp: 90 capsule, Rfl: 5 .  [name] (UBRELVY) 50 MG TABS, Take 50 mg by mouth daily as needed., Disp: 10 tablet, Rfl: 5 .  VYVANSE 70 MG capsule, Take 1 capsule (70 mg total) by mouth daily., Disp: 90 capsule, Rfl: 0 .  drospirenone-ethinyl estradiol (YASMIN,ZARAH,SYEDA) 3-0.03 MG tablet, Take 1 tablet by mouth daily., Disp: , Rfl:  .  sertraline (ZOLOFT) 50 MG tablet, Take 1 tablet (50 mg total) by mouth daily., Disp: 90 tablet, Rfl: 3  PAST MEDICAL HISTORY: Past Medical History:  Diagnosis Date  . Connective tissue disease ([place])   . Fatigue   . Hypovitaminosis D   . Lupus ([place])   . Lupus ([place])   . 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 Pericarditis   . Thrush     PAST SURGICAL HISTORY: Past Surgical History:  Procedure Laterality Date  . CESAREAN SECTION    . HERNIA REPAIR    . MOUTH SURGERY  03/2016    FAMILY HISTORY: Family History  Problem Relation Age of Onset  . Hypertension Father   . Lupus Maternal Grandmother   . Lupus Unknown     SOCIAL HISTORY:  Social History   Socioeconomic History  . Marital status: Married    Spouse name: Not on file  . Number of children: Not on file  . Years of education: Not on file    . Highest education level: Not on file  Occupational History  . Not on file  Tobacco Use  . Smoking status: Never Smoker  . Smokeless tobacco: Never Used  Substance and Sexual Activity  . Alcohol use: No  . Drug use: No  . Sexual activity: Not on file  Other Topics Concern  . Not on file  Social History Narrative   Lives at home w/ her family   Right-handed   Caffeine: cup of tea per day   Social Determinants of Health   Financial Resource Strain:   . Difficulty of Paying Living Expenses: Not on file  Food Insecurity:   . Worried About Charity fundraiser  in the Last Year: Not on file  . Ran Out of Food in the Last Year: Not on file  Transportation Needs:   . Lack of Transportation (Medical): Not on file  . Lack of Transportation (Non-Medical): Not on file  Physical Activity:   . Days of Exercise per Week: Not on file  . Minutes of Exercise per Session: Not on file  Stress:   . Feeling of Stress : Not on file  Social Connections:   . Frequency of Communication with Friends and Family: Not on file  . Frequency of Social Gatherings with Friends and Family: Not on file  . Attends Religious Services: Not on file  . Active Member of Clubs or Organizations: Not on file  . Attends Archivist Meetings: Not on file  . Marital Status: Not on file  Intimate Partner Violence:   . Fear of Current or Ex-Partner: Not on file  . Emotionally Abused: Not on file  . Physically Abused: Not on file  . Sexually Abused: Not on file     PHYSICAL EXAM  Vitals:   05/07/20 1040  BP: (!) 151/89  Pulse: 82  Weight: 154 lb (69.9 kg)  Height: 5' 5"  (1.651 m)   repeat BP 150/90  Body mass index is 25.63 kg/m.   General: The patient is well-developed and well-nourished and in no acute distress.     Neurologic Exam  Mental status: The patient is alert and oriented x 3 at the time of the examination. The patient has apparent normal recent and remote memory, with an  apparently normal attention span and concentration ability.   Speech is normal.  Cranial nerves: Extraocular movements are full.  Facial strength is normal.  Motor:  Muscle bulk is normal.   Tone is normal. Strength is  5 / 5 in all 4 extremities.   Sensory: Sensory testing is intact to touch sensation in all 4 extremities.   Gait and station: Station is normal.   The gait and the tandem gait are normal.  Reflexes: Deep tendon reflexes are symmetric and normal bilaterally.        ASSESSMENT AND PLAN  Attention deficit disorder (ADD) without hyperactivity  Sacroiliac joint pain  Migraine without aura and without status migrainosus, not intractable  Systemic lupus erythematosus, unspecified SLE type, unspecified organ involvement status ([place])  Depression with anxiety  1.   Continue Vyvanse 70 mg in the morning and an additional 5 to 10 mg as needed once or twice in the afternoon.   2.   Muscle spasms are better so she has not treated much tizanidine. 2.   SI joint pain is better.   If she has severe pain, she can take a oxycodone. 3.   Continue Ubrelzy and/your Percocet for migraine breakthrough.   4.   Add Zoloft for depression/anxiety.   5.   She will return to see me in 6 months or sooner if there are new or worsening neurologic symptoms.       Blair Mesina A. Felecia Shelling, MD, PhD 16/07/958, 4:54 PM Certified in Neurology, Clinical Neurophysiology, Sleep Medicine, Pain Medicine and Neuroimaging  [facility] [address], [place],  [address] [phone]

## 2020-05-14 ENCOUNTER — Ambulatory Visit: Payer: PRIVATE HEALTH INSURANCE | Admitting: Neurology

## 2020-07-08 ENCOUNTER — Other Ambulatory Visit: Payer: Self-pay | Admitting: *Deleted

## 2020-07-08 ENCOUNTER — Other Ambulatory Visit: Payer: Self-pay | Admitting: Neurology

## 2020-10-09 ENCOUNTER — Other Ambulatory Visit: Payer: Self-pay | Admitting: *Deleted

## 2020-10-09 MED ORDER — VYVANSE 70 MG PO CAPS: 70.0000 mg | ORAL_CAPSULE | Freq: Every day | ORAL | 0 refills | Status: DC

## 2020-10-09 MED ORDER — UBRELVY 50 MG PO TABS: 50.0000 mg | ORAL_TABLET | Freq: Every day | ORAL | 5 refills | Status: DC | PRN

## 2020-10-09 NOTE — Addendum Note (Signed)
Addended by: Despina Arias A on: 10/09/2020 12:42 PM   Modules accepted: Orders

## 2020-11-06 ENCOUNTER — Telehealth: Payer: Self-pay | Admitting: Neurology

## 2020-11-06 NOTE — Telephone Encounter (Signed)
Pt needs to r/s appointment on 5/17 to either AM slot that day or any time the next day. Please give pt these options if she calls back.

## 2020-11-19 ENCOUNTER — Ambulatory Visit: Payer: PRIVATE HEALTH INSURANCE | Admitting: Neurology

## 2020-12-17 ENCOUNTER — Ambulatory Visit: Payer: PRIVATE HEALTH INSURANCE | Admitting: Neurology

## 2020-12-17 ENCOUNTER — Encounter: Payer: Self-pay | Admitting: Neurology

## 2020-12-17 ENCOUNTER — Other Ambulatory Visit: Payer: Self-pay

## 2020-12-17 VITALS — BP 128/82 | HR 88 | Ht 65.0 in | Wt 156.5 lb

## 2020-12-17 DIAGNOSIS — M533 Sacrococcygeal disorders, not elsewhere classified: Secondary | ICD-10-CM | POA: Diagnosis not present

## 2020-12-17 DIAGNOSIS — M329 Systemic lupus erythematosus, unspecified: Secondary | ICD-10-CM

## 2020-12-17 DIAGNOSIS — G43009 Migraine without aura, not intractable, without status migrainosus: Secondary | ICD-10-CM | POA: Diagnosis not present

## 2020-12-17 DIAGNOSIS — F418 Other specified anxiety disorders: Secondary | ICD-10-CM

## 2020-12-17 DIAGNOSIS — F988 Other specified behavioral and emotional disorders with onset usually occurring in childhood and adolescence: Secondary | ICD-10-CM | POA: Diagnosis not present

## 2020-12-17 MED ORDER — LISDEXAMFETAMINE DIMESYLATE 50 MG PO CAPS: 50.0000 mg | ORAL_CAPSULE | Freq: Every day | ORAL | 0 refills | Status: DC

## 2020-12-17 NOTE — Progress Notes (Signed)
[facility]  PATIENT: [name] DOB: 19-Mar-1971  REFERRING DOCTOR OR PCP:  [name]     SOURCE: patient and records form [facility]  _________________________________   HISTORICAL  CHIEF COMPLAINT:  Chief Complaint  Patient presents with   Follow-up    RM 12, alone. Last seen 05/07/2020. Changed from sertraline to effexor. Wants to discuss vyvanse dose d/t this change.     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      Update 12/17/2020 She feels ADD does well on Vyvanse 70 mg + 10-20 mg Adderall as needed.  Focusis still sometimes reduced in the afternoons, even with stimulants.   She has had some depression.   She has some anxiety, mostly at work when overloaded.   She is doing better with mood on Effexor than on sertaline.   However, BP elevates at times.   We discussed lower dose of stimulant.    She sleeps better on current regimen so has not needed to use tizanidine more than a couple times and has not needed any benzo.   Weight is stable.     Migraines are doing fairly well with about 3/month.    [name] has stopped most migraines quickly.   She takes oxycodone if HA is worse.  Rheumatologic issues are generally better.  She has less SI joint pain than in the past.    She plays tennis frequently but had a recent toe dislocation.    Vit D and B12 were low and    SI joint pain/SLE:    She has systemic lupus erythematosus (Dr. Myrtie Banks).  She is on Plaquenil and methotrexate with benefit.  The onset of SLE followed a parvovirus infection in 2012.   At that time, her ANA was positive with the subsets showing the presence of anti-DNA antibodies. ESR was also mildly elevated.  SI joint improved after PT.   SI joint has done well x years.  She has had several fluoroscopically guided SI joint injections with benefit.  She has used only a few percocets this  year.  She tries to exercise regularly and tennis .     ADD: She has attention deficit disorder x many years.    On a test in 2011, she had poor performance in complex attentional and cognitive flexibility tasks.  Medications have helped her to stay focused and to perform better at work.   Both kids are at [employer].   Her son is interested in PT and daughter media/entrepreneurship.  REVIEW OF SYSTEMS: Constitutional: No fevers, chills, sweats, or change in appetite Eyes: No visual changes, double vision, eye pain Ear, nose and throat: No hearing loss, ear pain, nasal congestion, sore throat Cardiovascular: No chest pain, palpitations Respiratory:  No shortness of breath at rest or with exertion.   No wheezes GastrointestinaI: No nausea, vomiting, diarrhea, abdominal pain, fecal incontinence Genitourinary:  No dysuria, urinary retention or frequency.  No nocturia. Musculoskeletal:  as above Integumentary: No rash, pruritus, skin lesions Neurological: as above Psychiatric: No depression at this time.  No anxiety Endocrine: No palpitations, diaphoresis, change in appetite, change in weigh or increased thirst Hematologic/Lymphatic:  No anemia, purpura, petechiae. Allergic/Immunologic: No itchy/runny eyes, nasal congestion, recent allergic reactions, rashes  ALLERGIES: Allergies  Allergen Reactions   Sulfa Antibiotics     angioedema    HOME MEDICATIONS:  Current Outpatient Medications:    amphetamine-dextroamphetamine (ADDERALL) 10  MG tablet, One po qPM, Disp: 90 tablet, Rfl: 0   Cyanocobalamin (VITAMIN B-12 PO), Take 1 tablet by mouth daily. , Disp: , Rfl:    cycloSPORINE (RESTASIS) 0.05 % ophthalmic emulsion, Place 1 drop into both eyes daily. , Disp: , Rfl:    Ergocalciferol (VITAMIN D2 PO), Take 50,000 Units by mouth once a week., Disp: , Rfl:    Ferrous Sulfate (IRON PO), Take 325 mg by mouth in the morning and at bedtime., Disp: , Rfl:    folic acid (FOLVITE) 1 MG tablet, Take 1 mg  by mouth daily. 3-4 mg daily, Disp: , Rfl: 0   hydroxychloroquine (PLAQUENIL) 200 MG tablet, Take 300 mg by mouth daily. , Disp: , Rfl:    lisdexamfetamine (VYVANSE) 50 MG capsule, Take 1 capsule (50 mg total) by mouth daily., Disp: 30 capsule, Rfl: 0   Methotrexate Sodium (METHOTREXATE, PF,) 200 MG/8ML injection, Inject subcutaneous 1 cc once a week, Disp: , Rfl:    nystatin (MYCOSTATIN) 100000 UNIT/ML suspension, as needed. , Disp: , Rfl: 0   ondansetron (ZOFRAN) 4 MG tablet, Take 1 tablet (4 mg total) by mouth every 8 (eight) hours as needed for nausea or vomiting., Disp: 20 tablet, Rfl: 3   oxyCODONE-acetaminophen (ROXICET) 5-325 MG tablet, Take 1 tablet by mouth every 4 (four) hours as needed for severe pain., Disp: 60 tablet, Rfl: 0   tiZANidine (ZANAFLEX) 4 MG capsule, Take 1 capsule (4 mg total) by mouth 3 (three) times daily as needed for muscle spasms., Disp: 90 capsule, Rfl: 5   Ubrogepant (UBRELVY) 50 MG TABS, Take 50 mg by mouth daily as needed., Disp: 10 tablet, Rfl: 5   venlafaxine XR (EFFEXOR-XR) 75 MG 24 hr capsule, Take 75 mg by mouth daily., Disp: , Rfl:    drospirenone-ethinyl estradiol (YASMIN,ZARAH,SYEDA) 3-0.03 MG tablet, Take 1 tablet by mouth daily., Disp: , Rfl:   PAST MEDICAL HISTORY: Past Medical History:  Diagnosis Date   Connective tissue disease ([place])    Fatigue    Hypovitaminosis D    Lupus (HCC)    Lupus (HCC)    Parvovirus B19 arthritis (HCC)    Pericarditis    Thrush     PAST SURGICAL HISTORY: Past Surgical History:  Procedure Laterality Date   CESAREAN SECTION     HERNIA REPAIR     MOUTH SURGERY  03/2016    FAMILY HISTORY: Family History  Problem Relation Age of Onset   Hypertension Father    Lupus Maternal Grandmother    Lupus Unknown     SOCIAL HISTORY:  Social History   Socioeconomic History   Marital status: Married    Spouse name: Not on file   Number of children: Not on file   Years of education: Not on file   Highest  education level: Not on file  Occupational History   Not on file  Tobacco Use   Smoking status: Never   Smokeless tobacco: Never  Substance and Sexual Activity   Alcohol use: No   Drug use: No   Sexual activity: Not on file  Other Topics Concern   Not on file  Social History Narrative   Lives at home w/ her family   Right-handed   Caffeine: cup of tea per day   Social Determinants of Health   Financial Resource Strain: Not on file  Food Insecurity: Not on file  Transportation Needs: Not on file  Physical Activity: Not on file  Stress: Not on file  Social Connections:  Not on file  Intimate Partner Violence: Not on file     PHYSICAL EXAM  Vitals:   12/17/20 0746  BP: 128/82  Pulse: 88  Weight: 156 lb 8 oz (71 kg)  Height: _0  (1.651 m)     Body mass index is 26.04 kg/m.   General: The patient is well-developed and well-nourished and in no acute distress.     Neurologic Exam  Mental status: The patient is alert and oriented x 3 at the time of the examination. The patient has apparent normal recent and remote memory, with an apparently normal attention span and concentration ability.   Speech is normal.  Cranial nerves: Extraocular movements are full.  Facial strength is normal.  Motor:  Muscle bulk is normal.   Tone is normal. Strength is  5 / 5 in all 4 extremities.   Sensory: Sensory testing is intact to touch sensation in all 4 extremities.   Gait and station: Station is normal.   The gait and tandem gait are fine  Reflexes: Deep tendon reflexes are symmetric and normal bilaterally.        ASSESSMENT AND PLAN  Attention deficit disorder (ADD) without hyperactivity  Sacroiliac joint pain  Migraine without aura and without status migrainosus, not intractable  Systemic lupus erythematosus, unspecified SLE type, unspecified organ involvement status ([place])  Depression with anxiety  1.   Change  Vyvanse 50 mg in the morning and an additional 5 to  10 mg Adderall as needed once or twice in the afternoon.   2.   prn tizanidine as needed 2.   SI joint pain is better.   If she has severe pain, she can take a oxycodone. 3.   Continue Ubrelzy for migraine breakthrough.   4.   Continue Effexor    5.   She will return to see me in 6 months or sooner if there are new or worsening neurologic symptoms.       Cormick Moss A. Felecia Shelling, MD, PhD 7/91/5041, 3:64 AM Certified in Neurology, Clinical Neurophysiology, Sleep Medicine, Pain Medicine and Neuroimaging  [facility] [address], [place], [place] [address] [phone]

## 2021-03-19 ENCOUNTER — Other Ambulatory Visit: Payer: Self-pay | Admitting: Neurology

## 2021-03-19 MED ORDER — LISDEXAMFETAMINE DIMESYLATE 70 MG PO CAPS: 70.0000 mg | ORAL_CAPSULE | Freq: Every day | ORAL | 0 refills | Status: DC

## 2021-05-05 ENCOUNTER — Other Ambulatory Visit: Payer: Self-pay | Admitting: *Deleted

## 2021-05-05 MED ORDER — LISDEXAMFETAMINE DIMESYLATE 70 MG PO CAPS: 70.0000 mg | ORAL_CAPSULE | Freq: Every day | ORAL | 0 refills | Status: DC

## 2021-06-13 ENCOUNTER — Telehealth: Payer: Self-pay | Admitting: Neurology

## 2021-06-13 NOTE — Telephone Encounter (Signed)
..   Pt understands that although there may be some limitations with this type of visit, we will take all precautions to reduce any security or privacy concerns.  Pt understands that this will be treated like an in office visit and we will file with pt's insurance, and there may be a patient responsible charge related to this service. ? ?

## 2021-06-17 ENCOUNTER — Encounter: Payer: Self-pay | Admitting: Neurology

## 2021-06-17 ENCOUNTER — Telehealth (INDEPENDENT_AMBULATORY_CARE_PROVIDER_SITE_OTHER): Payer: No Typology Code available for payment source | Admitting: Neurology

## 2021-06-17 DIAGNOSIS — M329 Systemic lupus erythematosus, unspecified: Secondary | ICD-10-CM | POA: Diagnosis not present

## 2021-06-17 DIAGNOSIS — G43009 Migraine without aura, not intractable, without status migrainosus: Secondary | ICD-10-CM | POA: Diagnosis not present

## 2021-06-17 DIAGNOSIS — F418 Other specified anxiety disorders: Secondary | ICD-10-CM

## 2021-06-17 DIAGNOSIS — M533 Sacrococcygeal disorders, not elsewhere classified: Secondary | ICD-10-CM

## 2021-06-17 DIAGNOSIS — F988 Other specified behavioral and emotional disorders with onset usually occurring in childhood and adolescence: Secondary | ICD-10-CM

## 2021-06-17 MED ORDER — AMPHETAMINE-DEXTROAMPHETAMINE 10 MG PO TABS: ORAL_TABLET | ORAL | 0 refills | Status: DC

## 2021-06-17 MED ORDER — UBRELVY 50 MG PO TABS: 50.0000 mg | ORAL_TABLET | Freq: Every day | ORAL | 5 refills | Status: DC | PRN

## 2021-06-17 NOTE — Progress Notes (Signed)
[facility]  PATIENT: [name] DOB: 1971-01-26  REFERRING DOCTOR OR PCP:  Odella Aquas     SOURCE: patient and records form [facility]  _________________________________   HISTORICAL  CHIEF COMPLAINT:  No chief complaint on file.   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      Virtual Visit via Video Note I connected with [name] on 06/17/21 at  8:30 AM EST by a video enabled telemedicine application and verified that I am speaking with the correct person.  I discussed the limitations of evaluation and management by telemedicine and the availability of in person appointments. The patient expressed understanding and agreed to proceed.  Patient in her parked car and provider at office  History of Present Illness:  Update 06/17/2021 She feels ADD does well on Vyvanse 70 mg + 10-20 mg Adderall as needed.  Focusis still sometimes reduced in the afternoons, even with stimulants.   She has had some depression.   She has some anxiety, mostly at work when overloaded.   She is doing better with mood on Effexor than on sertaline.     BP was elevated some but was no better with Vyvanse 50 than on 70 mg and 70 mg works much better for her ADD.Marland Kitchen   We discussed lower dose of stimulant.    She sleeps better on current regimen so has not needed to use tizanidine more than a couple times and has not needed any benzo.   Weight is stable.     Migraines are doing very well with just a couple a month.    [name] helps for acute treatment when one occurs.   She had one severe one that also required an opiate while all others were knocked out with [name]    Rheumatologic issues are generally better.  She has less SI joint pain than in the past - at that time we did SI joint injections.    She plays tennis frequently.      Vit D and B12 were low and   Additional  History of issues: SI joint pain/SLE:    She has systemic lupus erythematosus (Dr. Rogers Bl[facility] at [facility]).  She is on Plaquenil and methotrexate with benefit.  The onset of SLE followed a parvovirus infection in 2012.   At that time, her ANA was positive with the subsets showing the presence of anti-DNA antibodies. ESR was also mildly elevated.  SI joint improved after PT.   SI joint has done well x years.  She has had several fluoroscopically guided SI joint injections with benefit.  She has used only a few percocets this year.  She tries to exercise regularly and tennis .     ADD: She has attention deficit disorder x many years.    On a test in 2011, she had poor performance in complex attentional and cognitive flexibility tasks.  Medications have helped her to stay focused and to perform better at work.  Both kids are at [employer].   Her son is interested in IT and daughter media/entrepreneurship.  REVIEW OF SYSTEMS: Constitutional: No fevers, chills, sweats, or change in appetite Eyes: No visual changes, double vision, eye pain Ear, nose and throat: No hearing loss, ear pain, nasal congestion, sore throat Cardiovascular: No chest pain, palpitations Respiratory:  No shortness of breath at rest or with exertion.   No wheezes GastrointestinaI: No nausea, vomiting, diarrhea,  abdominal pain, fecal incontinence Genitourinary:  No dysuria, urinary retention or frequency.  No nocturia. Musculoskeletal:  as above Integumentary: No rash, pruritus, skin lesions Neurological: as above Psychiatric: No depression at this time.  No anxiety Endocrine: No palpitations, diaphoresis, change in appetite, change in weigh or increased thirst Hematologic/Lymphatic:  No anemia, purpura, petechiae. Allergic/Immunologic: No itchy/runny eyes, nasal congestion, recent allergic reactions, rashes  ALLERGIES: Allergies  Allergen Reactions   Sulfa Antibiotics     angioedema    HOME MEDICATIONS:  Current Outpatient  Medications:    amphetamine-dextroamphetamine (ADDERALL) 10 MG tablet, One po qPM, Disp: 90 tablet, Rfl: 0   Cyanocobalamin (VITAMIN B-12 PO), Take 1 tablet by mouth daily. , Disp: , Rfl:    cycloSPORINE (RESTASIS) 0.05 % ophthalmic emulsion, Place 1 drop into both eyes daily. , Disp: , Rfl:    drospirenone-ethinyl estradiol (YASMIN,ZARAH,SYEDA) 3-0.03 MG tablet, Take 1 tablet by mouth daily., Disp: , Rfl:    Ergocalciferol (VITAMIN D2 PO), Take 50,000 Units by mouth once a week., Disp: , Rfl:    Ferrous Sulfate (IRON PO), Take 325 mg by mouth in the morning and at bedtime., Disp: , Rfl:    folic acid (FOLVITE) 1 MG tablet, Take 1 mg by mouth daily. 3-4 mg daily, Disp: , Rfl: 0   hydroxychloroquine (PLAQUENIL) 200 MG tablet, Take 300 mg by mouth daily. , Disp: , Rfl:    lisdexamfetamine (VYVANSE) 70 MG capsule, Take 1 capsule (70 mg total) by mouth daily., Disp: 90 capsule, Rfl: 0   Methotrexate Sodium (METHOTREXATE, PF,) 200 MG/8ML injection, Inject subcutaneous 1 cc once a week, Disp: , Rfl:    nystatin (MYCOSTATIN) 100000 UNIT/ML suspension, as needed. , Disp: , Rfl: 0   ondansetron (ZOFRAN) 4 MG tablet, Take 1 tablet (4 mg total) by mouth every 8 (eight) hours as needed for nausea or vomiting., Disp: 20 tablet, Rfl: 3   oxyCODONE-acetaminophen (ROXICET) 5-325 MG tablet, Take 1 tablet by mouth every 4 (four) hours as needed for severe pain., Disp: 60 tablet, Rfl: 0   tiZANidine (ZANAFLEX) 4 MG capsule, Take 1 capsule (4 mg total) by mouth 3 (three) times daily as needed for muscle spasms., Disp: 90 capsule, Rfl: 5   Ubrogepant (UBRELVY) 50 MG TABS, Take 50 mg by mouth daily as needed., Disp: 10 tablet, Rfl: 5   venlafaxine XR (EFFEXOR-XR) 75 MG 24 hr capsule, Take 75 mg by mouth daily., Disp: , Rfl:   PAST MEDICAL HISTORY: Past Medical History:  Diagnosis Date   Connective tissue disease ([place])    Fatigue    Hypovitaminosis D    Lupus (HCC)    Lupus (HCC)    Parvovirus B19 arthritis (HCC)     Pericarditis    Thrush     PAST SURGICAL HISTORY: Past Surgical History:  Procedure Laterality Date   CESAREAN SECTION     HERNIA REPAIR     MOUTH SURGERY  03/2016    FAMILY HISTORY: Family History  Problem Relation Age of Onset   Hypertension Father    Lupus Maternal Grandmother    Lupus Unknown     SOCIAL HISTORY:  Social History   Socioeconomic History   Marital status: Married    Spouse name: Not on file   Number of children: Not on file   Years of education: Not on file   Highest education level: Not on file  Occupational History   Not on file  Tobacco Use   Smoking status: Never   Smokeless  tobacco: Never  Substance and Sexual Activity   Alcohol use: No   Drug use: No   Sexual activity: Not on file  Other Topics Concern   Not on file  Social History Narrative   Lives at home w/ her family   Right-handed   Caffeine: cup of tea per day   Social Determinants of Health   Financial Resource Strain: Not on file  Food Insecurity: Not on file  Transportation Needs: Not on file  Physical Activity: Not on file  Stress: Not on file  Social Connections: Not on file  Intimate Partner Violence: Not on file     PHYSICAL EXAM She is a well-developed well-nourished woman in no acute distress.  The head is normocephalic and atraumatic.  Sclera are anicteric.  Visible skin appears normal.  The neck has a good range of motion.    She is alert and fully oriented with fluent speech and good attention, knowledge and memory.  Extraocular muscles are intact.  Facial strength is normal.   She appears to have normal strength in the arms.        ASSESSMENT AND PLAN  Attention deficit disorder (ADD) without hyperactivity  Sacroiliac joint pain  Migraine without aura and without status migrainosus, not intractable  Systemic lupus erythematosus, unspecified SLE type, unspecified organ involvement status ([place])  Depression with anxiety  1.   Continue  Vyvanse  70 mg in the morning and an additional 5 to 10 mg Adderall as needed once or twice in the afternoon.   2.   prn tizanidine as needed 2.   SI joint pain is better.   If she has severe pain, she can take a oxycodone. 3.   Continue Ubrelzy for migraine breakthrough.    Refill 4.   Continue Effexor    5.   She will return to see me in 6 months or sooner if there are new or worsening neurologic symptoms.    Follow Up Instructions: I discussed the assessment and treatment plan with the patient. The patient was provided an opportunity to ask questions and all were answered. The patient agreed with the plan and demonstrated an understanding of the instructions.    The patient was advised to call back or seek an in-person evaluation if the symptoms worsen or if the condition fails to improve as anticipated.  I provided 20 minutes of non-face-to-face time during this encounter.    Richard A. Felecia Shelling, MD, PhD 37/16/9678, 9:38 AM Certified in Neurology, Clinical Neurophysiology, Sleep Medicine, Pain Medicine and Neuroimaging  [facility] [address] [place], [place] [address] [phone]

## 2021-08-17 ENCOUNTER — Other Ambulatory Visit: Payer: Self-pay | Admitting: Neurology

## 2021-08-17 ENCOUNTER — Encounter: Payer: Self-pay | Admitting: Neurology

## 2021-08-18 ENCOUNTER — Other Ambulatory Visit: Payer: Self-pay | Admitting: Neurology

## 2021-08-18 MED ORDER — LISDEXAMFETAMINE DIMESYLATE 70 MG PO CAPS: 70.0000 mg | ORAL_CAPSULE | Freq: Every day | ORAL | 0 refills | Status: DC

## 2021-12-07 ENCOUNTER — Encounter: Payer: Self-pay | Admitting: Neurology

## 2021-12-08 ENCOUNTER — Other Ambulatory Visit: Payer: Self-pay | Admitting: *Deleted

## 2021-12-08 MED ORDER — LISDEXAMFETAMINE DIMESYLATE 70 MG PO CAPS: 70.0000 mg | ORAL_CAPSULE | Freq: Every day | ORAL | 0 refills | Status: DC

## 2021-12-08 MED ORDER — AMPHETAMINE-DEXTROAMPHETAMINE 10 MG PO TABS: ORAL_TABLET | ORAL | 0 refills | Status: DC

## 2021-12-23 ENCOUNTER — Ambulatory Visit (INDEPENDENT_AMBULATORY_CARE_PROVIDER_SITE_OTHER): Payer: PRIVATE HEALTH INSURANCE | Admitting: Neurology

## 2021-12-23 ENCOUNTER — Encounter: Payer: Self-pay | Admitting: Neurology

## 2021-12-23 VITALS — BP 156/93 | HR 82 | Ht 65.0 in | Wt 172.0 lb

## 2021-12-23 DIAGNOSIS — F418 Other specified anxiety disorders: Secondary | ICD-10-CM | POA: Diagnosis not present

## 2021-12-23 DIAGNOSIS — G43009 Migraine without aura, not intractable, without status migrainosus: Secondary | ICD-10-CM | POA: Diagnosis not present

## 2021-12-23 DIAGNOSIS — M533 Sacrococcygeal disorders, not elsewhere classified: Secondary | ICD-10-CM | POA: Diagnosis not present

## 2021-12-23 DIAGNOSIS — F988 Other specified behavioral and emotional disorders with onset usually occurring in childhood and adolescence: Secondary | ICD-10-CM

## 2021-12-23 DIAGNOSIS — M329 Systemic lupus erythematosus, unspecified: Secondary | ICD-10-CM

## 2021-12-23 MED ORDER — UBRELVY 50 MG PO TABS: 50.0000 mg | ORAL_TABLET | Freq: Every day | ORAL | 11 refills | Status: DC | PRN

## 2021-12-23 MED ORDER — AMPHETAMINE-DEXTROAMPHETAMINE 10 MG PO TABS: ORAL_TABLET | ORAL | 0 refills | Status: DC

## 2021-12-23 NOTE — Progress Notes (Signed)
[facility]  PATIENT: [name] DOB: [birth year]  REFERRING DOCTOR OR PCP:  Odella Aquas     SOURCE: patient and records form [facility]  _________________________________   HISTORICAL  CHIEF COMPLAINT:  Chief Complaint  Patient presents with   Follow-up    RM 2, alone. Last seen 06/17/21.  Having some more headaches.     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      Update 12/23/2021 She feels that the ADD does well with Vyvanse 70 mg + 10-20 mg Adderall as needed.  Focus is still sometimes reduced in the afternoons, even with stimulants.   Her blood pressure today was elevated and she will be contacting her primary care physician.  She also has had some weight gain since the last visit.  Mood has done better on the Effexor than sertraline.  However, BP did better with other medications.  Tizanidine has helped her sleep at night.  Migraines are doing fairly well with about 3/month.    [name] has stopped most migraines quickly.     She takes oxycodone if HA is worse.  Rheumatologic issues are generally better.  She has less SI joint pain than in the past.    She tries to stay active and plays tennis frequently.    Vit D and B12 were low in the past.   Summary of active issues: SI joint pain/SLE:    She has systemic lupus erythematosus (Dr. Myrtie Neither).  She is on Plaquenil and methotrexate with benefit.  The onset of SLE followed a parvovirus infection in 2012.   At that time, her ANA was positive with the subsets showing the presence of anti-DNA antibodies. ESR was also mildly elevated.  SI joint improved after PT.   SI joint has done well x years.  She has had several fluoroscopically guided SI joint injections with benefit.  She has used only a few percocets this year.  She tries to exercise regularly and tennis .     ADD: She has attention deficit  disorder x many years.    On a test in 2011, she had poor performance in complex attentional and cognitive flexibility tasks.  Medications have helped her to stay focused and to perform better at work.   REVIEW OF SYSTEMS: Constitutional: No fevers, chills, sweats, or change in appetite Eyes: No visual changes, double vision, eye pain Ear, nose and throat: No hearing loss, ear pain, nasal congestion, sore throat Cardiovascular: No chest pain, palpitations Respiratory:  No shortness of breath at rest or with exertion.   No wheezes GastrointestinaI: No nausea, vomiting, diarrhea, abdominal pain, fecal incontinence Genitourinary:  No dysuria, urinary retention or frequency.  No nocturia. Musculoskeletal:  as above Integumentary: No rash, pruritus, skin lesions Neurological: as above Psychiatric: No depression at this time.  No anxiety Endocrine: No palpitations, diaphoresis, change in appetite, change in weigh or increased thirst Hematologic/Lymphatic:  No anemia, purpura, petechiae. Allergic/Immunologic: No itchy/runny eyes, nasal congestion, recent allergic reactions, rashes  ALLERGIES: Allergies  Allergen Reactions   Sulfa Antibiotics     angioedema    HOME MEDICATIONS:  Current Outpatient Medications:    Cyanocobalamin (VITAMIN B-12 PO), Take 1 tablet by mouth daily. , Disp: , Rfl:    cycloSPORINE (RESTASIS) 0.05 % ophthalmic emulsion, Place 1 drop into both eyes daily. , Disp: , Rfl:    Ergocalciferol (VITAMIN D2 PO), Take 50,000 Units by  mouth once a week., Disp: , Rfl:    Ferrous Sulfate (IRON PO), Take 325 mg by mouth in the morning and at bedtime., Disp: , Rfl:    folic acid (FOLVITE) 1 MG tablet, Take 1 mg by mouth daily. 3-4 mg daily, Disp: , Rfl: 0   hydroxychloroquine (PLAQUENIL) 200 MG tablet, Take 300 mg by mouth daily. , Disp: , Rfl:    lisdexamfetamine (VYVANSE) 70 MG capsule, Take 1 capsule (70 mg total) by mouth daily., Disp: 90 capsule, Rfl: 0   Methotrexate  Sodium (METHOTREXATE, PF,) 200 MG/8ML injection, Inject subcutaneous 1 cc once a week, Disp: , Rfl:    nystatin (MYCOSTATIN) 100000 UNIT/ML suspension, as needed. , Disp: , Rfl: 0   ondansetron (ZOFRAN) 4 MG tablet, Take 1 tablet (4 mg total) by mouth every 8 (eight) hours as needed for nausea or vomiting., Disp: 20 tablet, Rfl: 3   oxyCODONE-acetaminophen (ROXICET) 5-325 MG tablet, Take 1 tablet by mouth every 4 (four) hours as needed for severe pain., Disp: 60 tablet, Rfl: 0   tiZANidine (ZANAFLEX) 4 MG capsule, Take 1 capsule (4 mg total) by mouth 3 (three) times daily as needed for muscle spasms., Disp: 90 capsule, Rfl: 5   venlafaxine XR (EFFEXOR-XR) 75 MG 24 hr capsule, Take 75 mg by mouth daily., Disp: , Rfl:    amphetamine-dextroamphetamine (ADDERALL) 10 MG tablet, One po qPM, Disp: 90 tablet, Rfl: 0   drospirenone-ethinyl estradiol (YASMIN,ZARAH,SYEDA) 3-0.03 MG tablet, Take 1 tablet by mouth daily., Disp: , Rfl:    Ubrogepant (UBRELVY) 50 MG TABS, Take 50 mg by mouth daily as needed., Disp: 10 tablet, Rfl: 11  PAST MEDICAL HISTORY: Past Medical History:  Diagnosis Date   Connective tissue disease ([place])    Fatigue    Hypovitaminosis D    Lupus (HCC)    Lupus (HCC)    Parvovirus B19 arthritis (HCC)    Pericarditis    Thrush     PAST SURGICAL HISTORY: Past Surgical History:  Procedure Laterality Date   CESAREAN SECTION     HERNIA REPAIR     MOUTH SURGERY  03/2016    FAMILY HISTORY: Family History  Problem Relation Age of Onset   Hypertension Father    Lupus Maternal Grandmother    Lupus Unknown     SOCIAL HISTORY:  Social History   Socioeconomic History   Marital status: Married    Spouse name: Not on file   Number of children: Not on file   Years of education: Not on file   Highest education level: Not on file  Occupational History   Not on file  Tobacco Use   Smoking status: Never   Smokeless tobacco: Never  Substance and Sexual Activity   Alcohol use:  No   Drug use: No   Sexual activity: Not on file  Other Topics Concern   Not on file  Social History Narrative   Lives at home w/ her family   Right-handed   Caffeine: cup of tea per day   Social Determinants of Health   Financial Resource Strain: Not on file  Food Insecurity: Not on file  Transportation Needs: Not on file  Physical Activity: Not on file  Stress: Not on file  Social Connections: Not on file  Intimate Partner Violence: Not on file     PHYSICAL EXAM  Vitals:   12/23/21 1113  BP: (!) 156/93  Pulse: 82  Weight: 172 lb (78 kg)  Height: 5' 5"  (1.651 m)  Body mass index is 28.62 kg/m.   General: The patient is well-developed and well-nourished and in no acute distress.     Neurologic Exam  Mental status: The patient is alert and oriented x 3 at the time of the examination. The patient has apparent normal recent and remote memory, with an apparently normal attention span and concentration ability.   Speech is normal.  Cranial nerves: Extraocular movements are full.  Facial strength is normal.  Motor:  Muscle bulk is normal.   Tone is normal. Strength is  5 / 5 in all 4 extremities.   Sensory: Sensory testing is intact to touch sensation in all 4 extremities.   Gait and station: Station is normal.   The gait and tandem gait are fine  Reflexes: Deep tendon reflexes are symmetric and normal bilaterally.        ASSESSMENT AND PLAN  Attention deficit disorder (ADD) without hyperactivity  Migraine without aura and without status migrainosus, not intractable  Sacroiliac joint pain  Depression with anxiety  Systemic lupus erythematosus, unspecified SLE type, unspecified organ involvement status ([place])   1.   Continue Vyvanse 50 mg in the morning and an additional 5 to 10 mg Adderall as needed once or twice in the afternoon.   2.   tizanidine as needed at bedtime 2.   SI joint pain is better.   If she has severe pain, she can take a  oxycodone. 3.   Continue Ubrelzy for migraine breakthrough.   4.   Continue Effexor    5.   She will follow-up with her primary care physician about the elevated blood pressure.   6.   She will return to see me in 6 months or sooner if there are new or worsening neurologic symptoms.       Assata Juncaj A. Felecia Shelling, MD, PhD 4/80/1655, 3:74 PM Certified in Neurology, Clinical Neurophysiology, Sleep Medicine, Pain Medicine and Neuroimaging  [facility] [address] [place], [place] [address] [phone]

## 2021-12-24 ENCOUNTER — Telehealth: Payer: Self-pay | Admitting: *Deleted

## 2021-12-24 NOTE — Telephone Encounter (Signed)
Request Reference Number: [id number]. UBRELVY TAB 50MG  is approved through 03/26/2022. Your patient may now fill this prescription and it will be covered.

## 2021-12-24 NOTE — Telephone Encounter (Signed)
Submitted PA Ubrelvy on [facility]. Key: [id number]. Waiting on determination from [facility].

## 2022-01-05 ENCOUNTER — Other Ambulatory Visit: Payer: Self-pay | Admitting: Neurology

## 2022-01-07 MED ORDER — AMPHETAMINE-DEXTROAMPHETAMINE 10 MG PO TABS: ORAL_TABLET | ORAL | 0 refills | Status: DC

## 2022-03-10 ENCOUNTER — Encounter: Payer: Self-pay | Admitting: *Deleted

## 2022-03-10 ENCOUNTER — Other Ambulatory Visit: Payer: Self-pay | Admitting: *Deleted

## 2022-03-10 ENCOUNTER — Telehealth: Payer: Self-pay | Admitting: *Deleted

## 2022-03-10 ENCOUNTER — Encounter: Payer: Self-pay | Admitting: Neurology

## 2022-03-10 MED ORDER — AMPHETAMINE-DEXTROAMPHETAMINE 10 MG PO TABS: ORAL_TABLET | ORAL | 0 refills | Status: DC

## 2022-03-10 MED ORDER — LISDEXAMFETAMINE DIMESYLATE 70 MG PO CAPS
70.0000 mg | ORAL_CAPSULE | Freq: Every day | ORAL | 0 refills | Status: DC
Start: 2022-03-10 — End: 2022-08-18

## 2022-03-10 NOTE — Telephone Encounter (Signed)
Submitted PA Ubrelvy on [facility]. Key: [id number]. Waiting on determination from [facility].

## 2022-03-10 NOTE — Telephone Encounter (Signed)
Request Reference Number: [id number]. UBRELVY TAB 50MG  is approved through 03/11/2023. Your patient may now fill this prescription and it will be covered.

## 2022-06-30 ENCOUNTER — Telehealth (INDEPENDENT_AMBULATORY_CARE_PROVIDER_SITE_OTHER): Payer: PRIVATE HEALTH INSURANCE | Admitting: Neurology

## 2022-06-30 ENCOUNTER — Encounter: Payer: Self-pay | Admitting: Neurology

## 2022-06-30 DIAGNOSIS — G43009 Migraine without aura, not intractable, without status migrainosus: Secondary | ICD-10-CM

## 2022-06-30 DIAGNOSIS — F988 Other specified behavioral and emotional disorders with onset usually occurring in childhood and adolescence: Secondary | ICD-10-CM | POA: Diagnosis not present

## 2022-06-30 DIAGNOSIS — M533 Sacrococcygeal disorders, not elsewhere classified: Secondary | ICD-10-CM

## 2022-06-30 DIAGNOSIS — F418 Other specified anxiety disorders: Secondary | ICD-10-CM | POA: Diagnosis not present

## 2022-06-30 DIAGNOSIS — M329 Systemic lupus erythematosus, unspecified: Secondary | ICD-10-CM | POA: Diagnosis not present

## 2022-06-30 NOTE — Progress Notes (Signed)
[facility]  PATIENT: [name] DOB: 1970-08-17  REFERRING DOCTOR OR PCP:  Odella Aquas     SOURCE: patient and records form [facility]  _________________________________   HISTORICAL  CHIEF COMPLAINT:  No chief complaint on file.  Virtual Visit via Video Note I connected with [name] on 06/30/22 at 10:00 AM EST by a video enabled telemedicine application and verified that I am speaking with the correct person.  I discussed the limitations of evaluation and management by telemedicine and the availability of in person appointments. The patient expressed understanding and agreed to proceed.  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      Update 06/30/2022 She feels that the ADD does well with Vyvanse 70 mg + 10-20 mg Adderall as needed.  Focus is still sometimes reduced in the afternoons, even with stimulants.   Her blood pressure today was elevated and she will be contacting her primary care physician.  She also has had some weight gain since the last visit.  Migraines are doing well with about 3/month this time of year and 1/year other times.    [name] has stopped most migraines quickly.   She rarely uses oxycodone  Rheumatologic issues are generally better.  She has less SI joint pain than in the past.    She tries to stay active and plays tennis frequently.    Vit D and B12 were low in the past.    She hit her head hard on her desk reaching down 6-7 weeks ago,.  No LOC but she felt she had word finding difficulty and had a headache (but not migraine.  Se has had a few other concussions  She has some neck pain doing better.  She uses less tizanidine.      Mood has done better on the Effexor than sertraline.  However, BP did better with other medications.  Tizanidine has helped her sleep at night.   Summary  of active issues: SI joint pain/SLE:    She has systemic lupus erythematosus (Dr. Myrtie Neither).  She is on Plaquenil and methotrexate with benefit.  The onset of SLE followed a parvovirus infection in 2012.   At that time, her ANA was positive with the subsets showing the presence of anti-DNA antibodies. ESR was also mildly elevated.  SI joint improved after PT.   SI joint has done well x years.  She has had several fluoroscopically guided SI joint injections with benefit.  She has used only a few percocets this year.  She tries to exercise regularly and tennis .     ADD: She has attention deficit disorder x many years.    On a test in 2011, she had poor performance in complex attentional and cognitive flexibility tasks.  Medications have helped her to stay focused and to perform better at work.   REVIEW OF SYSTEMS: Constitutional: No fevers, chills, sweats, or change in appetite Eyes: No visual changes, double vision, eye pain Ear, nose and throat: No hearing loss, ear pain, nasal congestion, sore throat Cardiovascular: No chest pain, palpitations Respiratory:  No shortness of breath at rest or with exertion.   No wheezes GastrointestinaI: No nausea, vomiting, diarrhea, abdominal pain, fecal incontinence Genitourinary:  No dysuria, urinary retention or frequency.  No nocturia. Musculoskeletal:  as above Integumentary: No rash, pruritus, skin lesions Neurological: as above Psychiatric: No depression at  this time.  No anxiety Endocrine: No palpitations, diaphoresis, change in appetite, change in weigh or increased thirst Hematologic/Lymphatic:  No anemia, purpura, petechiae. Allergic/Immunologic: No itchy/runny eyes, nasal congestion, recent allergic reactions, rashes  ALLERGIES: Allergies  Allergen Reactions   Sulfa Antibiotics     angioedema    HOME MEDICATIONS:  Current Outpatient Medications:    amphetamine-dextroamphetamine (ADDERALL) 10 MG tablet, One po qPM, Disp: 90 tablet, Rfl:  0   Cyanocobalamin (VITAMIN B-12 PO), Take 1 tablet by mouth daily. , Disp: , Rfl:    cycloSPORINE (RESTASIS) 0.05 % ophthalmic emulsion, Place 1 drop into both eyes daily. , Disp: , Rfl:    drospirenone-ethinyl estradiol (YASMIN,ZARAH,SYEDA) 3-0.03 MG tablet, Take 1 tablet by mouth daily., Disp: , Rfl:    Ergocalciferol (VITAMIN D2 PO), Take 50,000 Units by mouth once a week., Disp: , Rfl:    Ferrous Sulfate (IRON PO), Take 325 mg by mouth in the morning and at bedtime., Disp: , Rfl:    folic acid (FOLVITE) 1 MG tablet, Take 1 mg by mouth daily. 3-4 mg daily, Disp: , Rfl: 0   hydroxychloroquine (PLAQUENIL) 200 MG tablet, Take 300 mg by mouth daily. , Disp: , Rfl:    lisdexamfetamine (VYVANSE) 70 MG capsule, Take 1 capsule (70 mg total) by mouth daily., Disp: 90 capsule, Rfl: 0   Methotrexate Sodium (METHOTREXATE, PF,) 200 MG/8ML injection, Inject subcutaneous 1 cc once a week, Disp: , Rfl:    nystatin (MYCOSTATIN) 100000 UNIT/ML suspension, as needed. , Disp: , Rfl: 0   ondansetron (ZOFRAN) 4 MG tablet, Take 1 tablet (4 mg total) by mouth every 8 (eight) hours as needed for nausea or vomiting., Disp: 20 tablet, Rfl: 3   oxyCODONE-acetaminophen (ROXICET) 5-325 MG tablet, Take 1 tablet by mouth every 4 (four) hours as needed for severe pain., Disp: 60 tablet, Rfl: 0   tiZANidine (ZANAFLEX) 4 MG capsule, Take 1 capsule (4 mg total) by mouth 3 (three) times daily as needed for muscle spasms., Disp: 90 capsule, Rfl: 5   Ubrogepant (UBRELVY) 50 MG TABS, Take 50 mg by mouth daily as needed., Disp: 10 tablet, Rfl: 11   venlafaxine XR (EFFEXOR-XR) 75 MG 24 hr capsule, Take 75 mg by mouth daily., Disp: , Rfl:   PAST MEDICAL HISTORY: Past Medical History:  Diagnosis Date   Connective tissue disease ([place])    Fatigue    Hypovitaminosis D    Lupus ([place])    Lupus (HCC)    Parvovirus B19 arthritis ([place])    Pericarditis    Thrush     PAST SURGICAL HISTORY: Past Surgical History:  Procedure Laterality  Date   CESAREAN SECTION     HERNIA REPAIR     MOUTH SURGERY  03/2016    FAMILY HISTORY: Family History  Problem Relation Age of Onset   Hypertension Father    Lupus Maternal Grandmother    Lupus Unknown         ASSESSMENT AND PLAN  Migraine without aura and without status migrainosus, not intractable  Attention deficit disorder (ADD) without hyperactivity  Depression with anxiety  Systemic lupus erythematosus, unspecified SLE type, unspecified organ involvement status (HCC)  Sacroiliac joint pain   1.   Continue Vyvanse 50 mg in the morning and an additional 5 to 10 mg Adderall as needed once or twice in the afternoon.   2.   tizanidine as needed at bedtime.  Continue Effexor. 2.   SI joint pain is better.   If she  has severe pain, she can take a oxycodone. 3.   Continue Ubrelzy for migraine breakthrough.  Oxycodone if headache is resistant 4.   She will return to see me in 6 months or sooner if there are new or worsening neurologic symptoms.     Follow Up Instructions: I discussed the assessment and treatment plan with the patient. The patient was provided an opportunity to ask questions and all were answered. The patient agreed with the plan and demonstrated an understanding of the instructions.    The patient was advised to call back or seek an in-person evaluation if the symptoms worsen or if the condition fails to improve as anticipated.  I provided 20 minutes of non-face-to-face time during this encounter.     Carlin Attridge A. Felecia Shelling, MD, PhD 29/92/4268, 34:19 AM Certified in Neurology, Clinical Neurophysiology, Sleep Medicine, Pain Medicine and Neuroimaging  [facility] [address], [place],  [address] [phone]

## 2022-08-14 ENCOUNTER — Other Ambulatory Visit: Payer: Self-pay | Admitting: Neurology

## 2022-12-15 ENCOUNTER — Other Ambulatory Visit: Payer: Self-pay | Admitting: Neurology

## 2022-12-15 MED ORDER — LISDEXAMFETAMINE DIMESYLATE 70 MG PO CAPS: 70.0000 mg | ORAL_CAPSULE | Freq: Every day | ORAL | 0 refills | Status: DC

## 2022-12-28 ENCOUNTER — Telehealth: Payer: Self-pay | Admitting: Neurology

## 2022-12-29 ENCOUNTER — Encounter: Payer: Self-pay | Admitting: Neurology

## 2022-12-29 ENCOUNTER — Telehealth: Payer: PRIVATE HEALTH INSURANCE | Admitting: Neurology

## 2022-12-29 ENCOUNTER — Telehealth: Payer: Self-pay | Admitting: Neurology

## 2022-12-29 DIAGNOSIS — M329 Systemic lupus erythematosus, unspecified: Secondary | ICD-10-CM | POA: Diagnosis not present

## 2022-12-29 DIAGNOSIS — F988 Other specified behavioral and emotional disorders with onset usually occurring in childhood and adolescence: Secondary | ICD-10-CM

## 2022-12-29 DIAGNOSIS — G43009 Migraine without aura, not intractable, without status migrainosus: Secondary | ICD-10-CM | POA: Diagnosis not present

## 2022-12-29 DIAGNOSIS — M533 Sacrococcygeal disorders, not elsewhere classified: Secondary | ICD-10-CM | POA: Diagnosis not present

## 2022-12-29 MED ORDER — UBRELVY 50 MG PO TABS: 50.0000 mg | ORAL_TABLET | Freq: Every day | ORAL | 11 refills | Status: AC | PRN

## 2022-12-29 NOTE — Progress Notes (Signed)
[facility]  PATIENT: [name] DOB: 1970/09/06  REFERRING DOCTOR OR PCP:  Clint Lipps     SOURCE: patient and records form [facility]  _________________________________   HISTORICAL  CHIEF COMPLAINT:  No chief complaint on file.  Virtual Visit via Video Note I connected with [name] on 12/29/22 at 11:30 AM EDT by a video enabled telemedicine application and verified that I am speaking with the correct person.  I discussed the limitations of evaluation and management by telemedicine and the availability of in person appointments. The patient expressed understanding and agreed to proceed.  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      Update 12/29/2022 She feels that the ADD does well most days with Vyvanse 70 mg + 10-20 mg Adderall as needed.  Focus is still reduced early am and in the afternoons, even with stimulants.   She often skips the IR on weekends but it helps her a lot during the week  Her blood pressure has been elevated and she tried a lower dose.  She also has had some weight gain since the last visit.  Migraines are doing well with about 3/month this time of year and 1/year other times.    [name] has stopped most migraines quickly.   She had one 3 day long migraine.  She rarely uses oxycodone.  She has some neck pain doing better. She does some yoga.   She uses less tizanidine.   Rheumatologic issues are generally better.  She has less SI joint pain than in the past.    She tries to stay active and plays tennis frequently.    Vit D and B12 were low in the past.    She is on methotrexate and plaquenil   Mood has done better on the Effexor than sertraline.  However, BP did better with other medications.  Tizanidine has helped her sleep at night.  She has a dental procedure next month (documenting  in case she gets any opiates from an outside source which would be fine with me as she just uses our prescription as needed)  Summary of active issues: SI joint pain/SLE:    She has systemic lupus erythematosus (Dr. Mechele Dawley).  She is on Plaquenil and methotrexate with benefit.  The onset of SLE followed a parvovirus infection in 2012.   At that time, her ANA was positive with the subsets showing the presence of anti-DNA antibodies. ESR was also mildly elevated.  SI joint improved after PT.   SI joint has done well x years.  She has had several fluoroscopically guided SI joint injections with benefit.  She has used only a few percocets this year.  She tries to exercise regularly and tennis .     ADD: She has attention deficit disorder x many years.    On a test in 2011, she had poor performance in complex attentional and cognitive flexibility tasks.  Medications have helped her to stay focused and to perform better at work.   REVIEW OF SYSTEMS: Constitutional: No fevers, chills, sweats, or change in appetite Eyes: No visual changes, double vision, eye pain Ear, nose and throat: No hearing loss, ear pain, nasal congestion, sore throat Cardiovascular: No chest pain, palpitations Respiratory:  No shortness of breath at rest or with exertion.   No wheezes GastrointestinaI: No nausea, vomiting, diarrhea, abdominal pain, fecal incontinence  Genitourinary:  No dysuria, urinary retention or frequency.  No nocturia. Musculoskeletal:  as above Integumentary: No rash, pruritus, skin lesions Neurological: as above Psychiatric: No depression at this time.  No anxiety Endocrine: No palpitations, diaphoresis, change in appetite, change in weigh or increased thirst Hematologic/Lymphatic:  No anemia, purpura, petechiae. Allergic/Immunologic: No itchy/runny eyes, nasal congestion, recent allergic reactions, rashes  ALLERGIES: Allergies  Allergen Reactions   Sulfa Antibiotics     angioedema    HOME  MEDICATIONS:  Current Outpatient Medications:    amphetamine-dextroamphetamine (ADDERALL) 10 MG tablet, One po qPM, Disp: 90 tablet, Rfl: 0   Cyanocobalamin (VITAMIN B-12 PO), Take 1 tablet by mouth daily. , Disp: , Rfl:    cycloSPORINE (RESTASIS) 0.05 % ophthalmic emulsion, Place 1 drop into both eyes daily. , Disp: , Rfl:    drospirenone-ethinyl estradiol (YASMIN,ZARAH,SYEDA) 3-0.03 MG tablet, Take 1 tablet by mouth daily., Disp: , Rfl:    Ergocalciferol (VITAMIN D2 PO), Take 50,000 Units by mouth once a week., Disp: , Rfl:    Ferrous Sulfate (IRON PO), Take 325 mg by mouth in the morning and at bedtime., Disp: , Rfl:    folic acid (FOLVITE) 1 MG tablet, Take 1 mg by mouth daily. 3-4 mg daily, Disp: , Rfl: 0   hydroxychloroquine (PLAQUENIL) 200 MG tablet, Take 300 mg by mouth daily. , Disp: , Rfl:    lisdexamfetamine (VYVANSE) 70 MG capsule, Take 1 capsule (70 mg total) by mouth daily., Disp: 90 capsule, Rfl: 0   Methotrexate Sodium (METHOTREXATE, PF,) 200 MG/8ML injection, Inject subcutaneous 1 cc once a week, Disp: , Rfl:    nystatin (MYCOSTATIN) 100000 UNIT/ML suspension, as needed. , Disp: , Rfl: 0   ondansetron (ZOFRAN) 4 MG tablet, Take 1 tablet (4 mg total) by mouth every 8 (eight) hours as needed for nausea or vomiting., Disp: 20 tablet, Rfl: 3   oxyCODONE-acetaminophen (ROXICET) 5-325 MG tablet, Take 1 tablet by mouth every 4 (four) hours as needed for severe pain., Disp: 60 tablet, Rfl: 0   tiZANidine (ZANAFLEX) 4 MG capsule, Take 1 capsule (4 mg total) by mouth 3 (three) times daily as needed for muscle spasms., Disp: 90 capsule, Rfl: 5   Ubrogepant (UBRELVY) 50 MG TABS, Take 1 tablet (50 mg total) by mouth daily as needed., Disp: 10 tablet, Rfl: 11   venlafaxine XR (EFFEXOR-XR) 75 MG 24 hr capsule, Take 75 mg by mouth daily., Disp: , Rfl:   PAST MEDICAL HISTORY: Past Medical History:  Diagnosis Date   Connective tissue disease (HCC)    Fatigue    Hypovitaminosis D    Lupus  (HCC)    Lupus (HCC)    Parvovirus B19 arthritis (HCC)    Pericarditis    Thrush     PAST SURGICAL HISTORY: Past Surgical History:  Procedure Laterality Date   CESAREAN SECTION     HERNIA REPAIR     MOUTH SURGERY  03/2016    FAMILY HISTORY: Family History  Problem Relation Age of Onset   Hypertension Father    Lupus Maternal Grandmother    Lupus Unknown         ASSESSMENT AND PLAN  Migraine without aura and without status migrainosus, not intractable  Systemic lupus erythematosus, unspecified SLE type, unspecified organ involvement status (HCC)  Sacroiliac joint pain  Attention deficit disorder (ADD) without hyperactivity   1.   Continue Vyvanse in the morning and an additional 5 to 10 mg Adderall as needed once or twice in the afternoon.  2.   tizanidine as needed at bedtime.  Continue Effexor. 2.   If the SI joint flares up, she can take a oxycodone. 3.   Continue Ubrelzy for migraine breakthrough.  Oxycodone if headache is resistant (this occurs rarely) 4.   She will return to see me in 6 months or sooner if there are new or worsening neurologic symptoms.     Follow Up Instructions: I discussed the assessment and treatment plan with the patient. The patient was provided an opportunity to ask questions and all were answered. The patient agreed with the plan and demonstrated an understanding of the instructions.    The patient was advised to call back or seek an in-person evaluation if the symptoms worsen or if the condition fails to improve as anticipated.  I provided 27 minutes of non-face-to-face time during this encounter.     Greysin Medlen A. Epimenio Foot, MD, PhD 12/29/2022, 11:56 AM Certified in Neurology, Clinical Neurophysiology, Sleep Medicine, Pain Medicine and Neuroimaging  [facility] [address] [place], [place] [address] [phone]

## 2022-12-29 NOTE — Telephone Encounter (Signed)
Sent mychart msg informing pt of follow up appt made with Dr. Epimenio Foot

## 2023-02-11 ENCOUNTER — Telehealth: Payer: Self-pay

## 2023-02-11 ENCOUNTER — Other Ambulatory Visit (HOSPITAL_COMMUNITY): Payer: Self-pay

## 2023-02-11 NOTE — Telephone Encounter (Signed)
Pharmacy Patient Advocate Encounter   Received notification from CoverMyMeds that prior authorization for Ubrelvy 50MG  tablets is required/requested.   Insurance verification completed.   The patient is insured through [facility] .   Per test claim: PA required; PA submitted to [facility] via CoverMyMeds Key/confirmation #/[id number] Status is pending

## 2023-02-15 ENCOUNTER — Other Ambulatory Visit (HOSPITAL_COMMUNITY): Payer: Self-pay

## 2023-02-15 NOTE — Telephone Encounter (Signed)
Pharmacy Patient Advocate Encounter  Received notification from [facility] that Prior Authorization for Ubrelvy 50MG  tablets has been APPROVED from 02/11/2023 to 02/11/2024. Ran test claim, Copay is $0. This test claim was processed through [facility]- copay amounts may vary at other pharmacies due to pharmacy/plan contracts, or as the patient moves through the different stages of their insurance plan.   PA #/Case ID/Reference #: PA Case ID #: [id number]

## 2023-04-18 ENCOUNTER — Encounter: Payer: Self-pay | Admitting: Neurology

## 2023-04-26 ENCOUNTER — Other Ambulatory Visit: Payer: Self-pay | Admitting: *Deleted

## 2023-04-26 MED ORDER — AMPHETAMINE-DEXTROAMPHETAMINE 10 MG PO TABS: ORAL_TABLET | ORAL | 0 refills | Status: DC

## 2023-04-26 MED ORDER — LISDEXAMFETAMINE DIMESYLATE 70 MG PO CAPS: 70.0000 mg | ORAL_CAPSULE | Freq: Every day | ORAL | 0 refills | Status: DC

## 2023-07-14 ENCOUNTER — Encounter: Payer: Self-pay | Admitting: Neurology

## 2023-07-15 ENCOUNTER — Ambulatory Visit: Payer: PRIVATE HEALTH INSURANCE | Admitting: Neurology

## 2024-01-14 ENCOUNTER — Other Ambulatory Visit (HOSPITAL_COMMUNITY): Payer: Self-pay

## 2024-04-18 ENCOUNTER — Encounter: Payer: Self-pay | Admitting: Neurology

## 2024-04-18 ENCOUNTER — Ambulatory Visit: Payer: PRIVATE HEALTH INSURANCE | Admitting: Neurology

## 2024-04-18 VITALS — BP 111/71 | HR 80 | Ht 63.0 in | Wt 172.0 lb

## 2024-04-18 DIAGNOSIS — M533 Sacrococcygeal disorders, not elsewhere classified: Secondary | ICD-10-CM

## 2024-04-18 DIAGNOSIS — G43009 Migraine without aura, not intractable, without status migrainosus: Secondary | ICD-10-CM

## 2024-04-18 DIAGNOSIS — F988 Other specified behavioral and emotional disorders with onset usually occurring in childhood and adolescence: Secondary | ICD-10-CM

## 2024-04-18 DIAGNOSIS — M329 Systemic lupus erythematosus, unspecified: Secondary | ICD-10-CM | POA: Diagnosis not present

## 2024-04-18 DIAGNOSIS — F418 Other specified anxiety disorders: Secondary | ICD-10-CM

## 2024-04-18 MED ORDER — AMPHETAMINE-DEXTROAMPHETAMINE 10 MG PO TABS: ORAL_TABLET | ORAL | 0 refills | Status: AC

## 2024-04-18 MED ORDER — LISDEXAMFETAMINE DIMESYLATE 70 MG PO CAPS: 70.0000 mg | ORAL_CAPSULE | Freq: Every day | ORAL | 0 refills | Status: DC

## 2024-04-18 NOTE — Progress Notes (Signed)
 [facility]  PATIENT: [name] DOB: Nov 21, 1970  REFERRING DOCTOR OR PCP:  Kalpen Patel     SOURCE: patient and records form [facility]  _________________________________   HISTORICAL  CHIEF COMPLAINT:  Chief Complaint  Patient presents with   Follow-up    Pt in room 12. Alone. Here for migraine follow up.    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      Update 04/18/2024: She feels that the ADD does well most days with Vyvanse  70 mg + 10-20 mg Adderall as needed. She has been out of her medication x several weeks.    Focus is still reduced early am and in the afternoons, even with stimulants.   She often skips the IR on weekends but it helps her a lot during the week  She has had elevated BP and her valsartan was increased - even off Vyvanse , BP has been elevated.  She also has had some weight gain since the last visit.  She is playing tennis and trying to eat better and has lost a few pounds.   She feels dieting is more difficult with menopause.  Migraines are doing well with about 3/month this time of year and 1/year other times.    Ubrelvy  has stopped most migraines quickly.   She had one 3 day long migraine.  She rarely uses oxycodone .  She has some neck pain doing better. She does some yoga.   She uses less tizanidine .      Rheumatologic issues are generally better.  She has less SI joint pain than in the past.    She tries to stay active and plays tennis frequently.    Vit D and B12 were low in the past.    She is on methotrexate and HCQ (sees [facility]).  SI joint pain is minimal.     Mood has done better on the Effexor.  She usually feels worse in Fall/winter.  However, BP did better with other medications.  Tizanidine  has helped her sleep at night.   She has used some clonazepam (prn)   Summary of active issues: SI joint pain/SLE:    She has  systemic lupus erythematosus.  She is on Plaquenil and methotrexate with benefit.  The onset of SLE followed a parvovirus infection in 2012.   At that time, her ANA was positive with the subsets showing the presence of anti-DNA antibodies. ESR was also mildly elevated.  SI joint improved after PT.   SI joint has done well x years.  She has had several fluoroscopically guided SI joint injections with benefit.  She has used only a few percocets this year.  She tries to exercise regularly and tennis .     ADD: She has attention deficit disorder x many years.    On a test in 2011, she had poor performance in complex attentional and cognitive flexibility tasks.  Medications have helped her to stay focused and to perform better at work.   REVIEW OF SYSTEMS: Constitutional: No fevers, chills, sweats, or change in appetite Eyes: No visual changes, double vision, eye pain Ear, nose and throat: No hearing loss, ear pain, nasal congestion, sore throat Cardiovascular: No chest pain, palpitations Respiratory:  No shortness of breath at rest or with exertion.   No wheezes GastrointestinaI: No nausea, vomiting, diarrhea, abdominal pain, fecal incontinence Genitourinary:  No dysuria, urinary retention or frequency.  No nocturia.  Musculoskeletal:  as above Integumentary: No rash, pruritus, skin lesions Neurological: as above Psychiatric: No depression at this time.  No anxiety Endocrine: No palpitations, diaphoresis, change in appetite, change in weigh or increased thirst Hematologic/Lymphatic:  No anemia, purpura, petechiae. Allergic/Immunologic: No itchy/runny eyes, nasal congestion, recent allergic reactions, rashes  ALLERGIES: Allergies  Allergen Reactions   Sulfa Antibiotics     angioedema    HOME MEDICATIONS:  Current Outpatient Medications:    amLODipine (NORVASC) 10 MG tablet, Take 10 mg by mouth daily., Disp: , Rfl:    Cyanocobalamin (VITAMIN B-12 PO), Take 1 tablet by mouth daily. , Disp: ,  Rfl:    cycloSPORINE (RESTASIS) 0.05 % ophthalmic emulsion, Place 1 drop into both eyes daily. , Disp: , Rfl:    Ferrous Sulfate (IRON PO), Take 325 mg by mouth in the morning and at bedtime., Disp: , Rfl:    folic acid (FOLVITE) 1 MG tablet, Take 1 mg by mouth daily. 3-4 mg daily, Disp: , Rfl: 0   hydroxychloroquine (PLAQUENIL) 200 MG tablet, Take 300 mg by mouth daily. , Disp: , Rfl:    Methotrexate Sodium (METHOTREXATE, PF,) 200 MG/8ML injection, Inject subcutaneous 1 cc once a week, Disp: , Rfl:    nystatin (MYCOSTATIN) 100000 UNIT/ML suspension, as needed. , Disp: , Rfl: 0   ondansetron  (ZOFRAN ) 4 MG tablet, Take 1 tablet (4 mg total) by mouth every 8 (eight) hours as needed for nausea or vomiting., Disp: 20 tablet, Rfl: 3   oxyCODONE -acetaminophen  (ROXICET) 5-325 MG tablet, Take 1 tablet by mouth every 4 (four) hours as needed for severe pain., Disp: 60 tablet, Rfl: 0   tiZANidine  (ZANAFLEX ) 4 MG capsule, Take 1 capsule (4 mg total) by mouth 3 (three) times daily as needed for muscle spasms., Disp: 90 capsule, Rfl: 5   Ubrogepant  (UBRELVY ) 50 MG TABS, Take 1 tablet (50 mg total) by mouth daily as needed., Disp: 10 tablet, Rfl: 11   valsartan (DIOVAN) 320 MG tablet, Take 320 mg by mouth daily., Disp: , Rfl:    venlafaxine XR (EFFEXOR-XR) 75 MG 24 hr capsule, Take 75 mg by mouth daily., Disp: , Rfl:    amphetamine -dextroamphetamine  (ADDERALL) 10 MG tablet, One po qPM, Disp: 90 tablet, Rfl: 0   drospirenone-ethinyl estradiol (YASMIN,ZARAH,SYEDA) 3-0.03 MG tablet, Take 1 tablet by mouth daily. (Patient not taking: Reported on 04/18/2024), Disp: , Rfl:    Ergocalciferol (VITAMIN D2 PO), Take 50,000 Units by mouth once a week. (Patient not taking: Reported on 04/18/2024), Disp: , Rfl:    lisdexamfetamine (VYVANSE ) 70 MG capsule, Take 1 capsule (70 mg total) by mouth daily., Disp: 90 capsule, Rfl: 0  PAST MEDICAL HISTORY: Past Medical History:  Diagnosis Date   Connective tissue disease     Fatigue    HTN (hypertension)    Hypovitaminosis D    Lupus    Lupus    Parvovirus B19 arthritis (HCC)    Pericarditis    Thrush     PAST SURGICAL HISTORY: Past Surgical History:  Procedure Laterality Date   CESAREAN SECTION     HERNIA REPAIR     MOUTH SURGERY  03/2016    FAMILY HISTORY: Family History  Problem Relation Age of Onset   Hypertension Father    Lupus Maternal Grandmother    Lupus Unknown     ________________________________ PHYSICAL EXAM  Vitals:   04/18/24 1455  BP: 111/71  Pulse: 80  Weight: 172 lb (78 kg)  Height: 5' 3 (1.6 m)  Body mass index is 30.47 kg/m.   General: The patient is well-developed and well-nourished and in no acute distress.  Head is [place]/AT.  Good range of motion in the neck   Skin: Extremities are without rash or  edema.  Musculoskeletal:  Back is nontender.  SI joints are nontender.  Neurologic Exam  Mental status: The patient is alert and oriented x 3 at the time of the examination. The patient has apparent normal recent and remote memory, with an apparently normal attention span and concentration ability.   Speech is normal.  Cranial nerves: Extraocular movements are full.  Normal facial strength..  Trapezius and sternocleidomastoid strength is normal. No dysarthria is noted.   No obvious hearing deficits are noted.  Motor:  Muscle bulk is normal.   Tone is normal. Strength is  5 / 5 in all 4 extremities.   Sensory: Sensory testing is intact to pinprick, soft touch and vibration sensation in all 4 extremities.  Coordination: Cerebellar testing reveals good finger-nose-finger and heel-to-shin bilaterally.  Gait and station: Station is normal.   Gait is normal. Tandem gait is normal. Romberg is negative.   Reflexes: Deep tendon reflexes are symmetric and normal bilaterally.     ASSESSMENT AND PLAN  Attention deficit disorder (ADD) without hyperactivity  Migraine without aura and without status migrainosus, not  intractable  Systemic lupus erythematosus, unspecified SLE type, unspecified organ involvement status (HCC)  Sacroiliac joint pain  Depression with anxiety   1.   Her ADD has been best controlled on Vyvanse  70 mg in the morning and an additional 5 to 10 mg of Adderall later in the afternoon.  I will send in refills.   2.   tizanidine  as needed at bedtime.  Continue Effexor. 2.   If the SI joint flares up, she can take a oxycodone . 3.   Migraines are generally doing well.  Continue Ubrelzy for migraine breakthrough.  Oxycodone  if headache is resistant (this occurs rarely) 4.   She will return to see me in 6 months or sooner if there are new or worsening neurologic symptoms.        [name] A. Vear, MD, PhD 04/18/2024, 4:00 PM Certified in Neurology, Clinical Neurophysiology, Sleep Medicine, Pain Medicine and Neuroimaging  [facility] [address], [address] [place], [place] [address] [phone]

## 2024-04-24 ENCOUNTER — Encounter: Payer: Self-pay | Admitting: Neurology

## 2024-04-25 ENCOUNTER — Other Ambulatory Visit: Payer: Self-pay | Admitting: Neurology

## 2024-04-25 MED ORDER — LISDEXAMFETAMINE DIMESYLATE 50 MG PO CAPS: 50.0000 mg | ORAL_CAPSULE | Freq: Every day | ORAL | 0 refills | Status: AC

## 2024-11-21 ENCOUNTER — Telehealth: Payer: PRIVATE HEALTH INSURANCE | Admitting: Neurology
